# Patient Record
Sex: Male | Born: 1970 | Race: White | Hispanic: No | Smoking: Current every day smoker
Health system: Southern US, Community
[De-identification: ages and names within clinical notes are randomized; demographics above are authoritative.]

## PROBLEM LIST (undated history)

## (undated) DIAGNOSIS — F17200 Nicotine dependence, unspecified, uncomplicated: Secondary | ICD-10-CM

## (undated) DIAGNOSIS — S069X9A Unspecified intracranial injury with loss of consciousness of unspecified duration, initial encounter: Secondary | ICD-10-CM

## (undated) DIAGNOSIS — S069XAA Unspecified intracranial injury with loss of consciousness status unknown, initial encounter: Secondary | ICD-10-CM

## (undated) DIAGNOSIS — S065XAA Traumatic subdural hemorrhage with loss of consciousness status unknown, initial encounter: Secondary | ICD-10-CM

## (undated) DIAGNOSIS — F1994 Other psychoactive substance use, unspecified with psychoactive substance-induced mood disorder: Secondary | ICD-10-CM

## (undated) DIAGNOSIS — A159 Respiratory tuberculosis unspecified: Secondary | ICD-10-CM

## (undated) DIAGNOSIS — M19171 Post-traumatic osteoarthritis, right ankle and foot: Secondary | ICD-10-CM

## (undated) DIAGNOSIS — F1910 Other psychoactive substance abuse, uncomplicated: Secondary | ICD-10-CM

## (undated) HISTORY — PX: OTHER SURGICAL HISTORY: SHX169

## (undated) HISTORY — PX: FRACTURE SURGERY: SHX138

---

## 2002-11-08 ENCOUNTER — Inpatient Hospital Stay (HOSPITAL_COMMUNITY): Admission: AC | Admit: 2002-11-08 | Discharge: 2002-12-30 | Payer: Self-pay

## 2002-11-08 ENCOUNTER — Encounter: Payer: Self-pay | Admitting: *Deleted

## 2002-11-10 ENCOUNTER — Encounter: Payer: Self-pay | Admitting: General Surgery

## 2002-11-11 ENCOUNTER — Encounter: Payer: Self-pay | Admitting: General Surgery

## 2002-11-11 ENCOUNTER — Encounter: Payer: Self-pay | Admitting: Neurosurgery

## 2002-11-12 ENCOUNTER — Encounter: Payer: Self-pay | Admitting: General Surgery

## 2002-11-13 ENCOUNTER — Encounter: Payer: Self-pay | Admitting: General Surgery

## 2002-11-18 ENCOUNTER — Encounter: Payer: Self-pay | Admitting: General Surgery

## 2002-11-19 ENCOUNTER — Encounter: Payer: Self-pay | Admitting: General Surgery

## 2002-11-21 ENCOUNTER — Encounter: Payer: Self-pay | Admitting: General Surgery

## 2002-11-24 ENCOUNTER — Encounter: Payer: Self-pay | Admitting: General Surgery

## 2002-12-21 ENCOUNTER — Encounter: Payer: Self-pay | Admitting: General Surgery

## 2002-12-22 ENCOUNTER — Encounter: Payer: Self-pay | Admitting: General Surgery

## 2002-12-24 ENCOUNTER — Encounter: Payer: Self-pay | Admitting: General Surgery

## 2002-12-30 ENCOUNTER — Inpatient Hospital Stay (HOSPITAL_COMMUNITY)
Admission: RE | Admit: 2002-12-30 | Discharge: 2003-01-20 | Payer: Self-pay | Admitting: Physical Medicine & Rehabilitation

## 2003-01-05 ENCOUNTER — Encounter: Payer: Self-pay | Admitting: Physical Medicine & Rehabilitation

## 2003-01-12 ENCOUNTER — Encounter: Payer: Self-pay | Admitting: Physical Medicine & Rehabilitation

## 2003-01-25 ENCOUNTER — Encounter
Admission: RE | Admit: 2003-01-25 | Discharge: 2003-03-26 | Payer: Self-pay | Admitting: Physical Medicine & Rehabilitation

## 2003-02-16 ENCOUNTER — Encounter: Admission: RE | Admit: 2003-02-16 | Discharge: 2003-02-16 | Payer: Self-pay | Admitting: Internal Medicine

## 2003-04-02 ENCOUNTER — Encounter
Admission: RE | Admit: 2003-04-02 | Discharge: 2003-07-01 | Payer: Self-pay | Admitting: Physical Medicine & Rehabilitation

## 2003-04-06 ENCOUNTER — Encounter: Payer: Self-pay | Admitting: Physical Medicine & Rehabilitation

## 2003-04-06 ENCOUNTER — Ambulatory Visit (HOSPITAL_COMMUNITY)
Admission: RE | Admit: 2003-04-06 | Discharge: 2003-04-06 | Payer: Self-pay | Admitting: Physical Medicine & Rehabilitation

## 2003-04-25 ENCOUNTER — Encounter: Payer: Self-pay | Admitting: Orthopedic Surgery

## 2003-04-25 ENCOUNTER — Ambulatory Visit (HOSPITAL_COMMUNITY): Admission: RE | Admit: 2003-04-25 | Discharge: 2003-04-25 | Payer: Self-pay | Admitting: Orthopedic Surgery

## 2003-05-24 ENCOUNTER — Ambulatory Visit (HOSPITAL_BASED_OUTPATIENT_CLINIC_OR_DEPARTMENT_OTHER): Admission: RE | Admit: 2003-05-24 | Discharge: 2003-05-24 | Payer: Self-pay | Admitting: Orthopedic Surgery

## 2003-06-08 ENCOUNTER — Encounter: Admission: RE | Admit: 2003-06-08 | Discharge: 2003-06-08 | Payer: Self-pay | Admitting: Internal Medicine

## 2003-06-08 ENCOUNTER — Encounter: Admission: RE | Admit: 2003-06-08 | Discharge: 2003-07-21 | Payer: Self-pay | Admitting: Orthopedic Surgery

## 2003-08-19 ENCOUNTER — Encounter: Admission: RE | Admit: 2003-08-19 | Discharge: 2003-08-19 | Payer: Self-pay | Admitting: Internal Medicine

## 2003-11-25 ENCOUNTER — Encounter: Admission: RE | Admit: 2003-11-25 | Discharge: 2003-11-25 | Payer: Self-pay | Admitting: Internal Medicine

## 2003-12-23 ENCOUNTER — Encounter: Admission: RE | Admit: 2003-12-23 | Discharge: 2003-12-23 | Payer: Self-pay | Admitting: Internal Medicine

## 2004-01-19 ENCOUNTER — Encounter: Admission: RE | Admit: 2004-01-19 | Discharge: 2004-01-19 | Payer: Self-pay | Admitting: Internal Medicine

## 2004-03-03 ENCOUNTER — Encounter: Admission: RE | Admit: 2004-03-03 | Discharge: 2004-03-03 | Payer: Self-pay | Admitting: Internal Medicine

## 2004-03-12 ENCOUNTER — Inpatient Hospital Stay (HOSPITAL_COMMUNITY): Admission: EM | Admit: 2004-03-12 | Discharge: 2004-03-16 | Payer: Self-pay | Admitting: Psychiatry

## 2004-03-12 ENCOUNTER — Ambulatory Visit: Payer: Self-pay | Admitting: Psychiatry
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
## 2004-07-24 ENCOUNTER — Ambulatory Visit: Payer: Self-pay | Admitting: Psychiatry

## 2004-07-24 ENCOUNTER — Inpatient Hospital Stay (HOSPITAL_COMMUNITY): Admission: AD | Admit: 2004-07-24 | Discharge: 2004-07-25 | Payer: Self-pay | Admitting: Psychiatry

## 2004-07-24 ENCOUNTER — Emergency Department (HOSPITAL_COMMUNITY): Admission: EM | Admit: 2004-07-24 | Discharge: 2004-07-24 | Payer: Self-pay | Admitting: Emergency Medicine

## 2004-11-23 ENCOUNTER — Emergency Department (HOSPITAL_COMMUNITY): Admission: EM | Admit: 2004-11-23 | Discharge: 2004-11-23 | Payer: Self-pay | Admitting: Emergency Medicine

## 2005-12-19 ENCOUNTER — Emergency Department (HOSPITAL_COMMUNITY): Admission: EM | Admit: 2005-12-19 | Discharge: 2005-12-19 | Payer: Self-pay | Admitting: Family Medicine

## 2008-08-30 ENCOUNTER — Emergency Department (HOSPITAL_COMMUNITY): Admission: EM | Admit: 2008-08-30 | Discharge: 2008-08-30 | Payer: Self-pay | Admitting: Emergency Medicine

## 2009-03-14 ENCOUNTER — Emergency Department (HOSPITAL_COMMUNITY): Admission: EM | Admit: 2009-03-14 | Discharge: 2009-03-14 | Payer: Self-pay | Admitting: Emergency Medicine

## 2009-12-13 ENCOUNTER — Emergency Department (HOSPITAL_COMMUNITY): Admission: EM | Admit: 2009-12-13 | Discharge: 2009-12-13 | Payer: Self-pay | Admitting: Emergency Medicine

## 2010-03-29 ENCOUNTER — Emergency Department (HOSPITAL_COMMUNITY): Admission: EM | Admit: 2010-03-29 | Discharge: 2010-03-29 | Payer: Self-pay | Admitting: Emergency Medicine

## 2010-08-19 ENCOUNTER — Emergency Department (HOSPITAL_COMMUNITY): Payer: No Typology Code available for payment source

## 2010-08-19 ENCOUNTER — Emergency Department (HOSPITAL_COMMUNITY)
Admission: EM | Admit: 2010-08-19 | Discharge: 2010-08-19 | Disposition: A | Discharge status: Home / Self Care | Payer: No Typology Code available for payment source | Attending: Emergency Medicine | Admitting: Emergency Medicine

## 2010-08-19 DIAGNOSIS — IMO0002 Reserved for concepts with insufficient information to code with codable children: Secondary | ICD-10-CM | POA: Insufficient documentation

## 2010-08-19 DIAGNOSIS — M79609 Pain in unspecified limb: Secondary | ICD-10-CM | POA: Insufficient documentation

## 2010-08-19 DIAGNOSIS — S0990XA Unspecified injury of head, initial encounter: Secondary | ICD-10-CM | POA: Insufficient documentation

## 2010-08-19 DIAGNOSIS — F1910 Other psychoactive substance abuse, uncomplicated: Secondary | ICD-10-CM | POA: Insufficient documentation

## 2010-08-19 DIAGNOSIS — M25559 Pain in unspecified hip: Secondary | ICD-10-CM | POA: Insufficient documentation

## 2010-08-19 LAB — RAPID URINE DRUG SCREEN, HOSP PERFORMED
Amphetamines: NOT DETECTED
Cocaine: POSITIVE — AB
Tetrahydrocannabinol: NOT DETECTED

## 2010-08-19 LAB — DIFFERENTIAL
Eosinophils Relative: 6 % — ABNORMAL HIGH (ref 0–5)
Lymphocytes Relative: 28 % (ref 12–46)
Lymphs Abs: 3.2 10*3/uL (ref 0.7–4.0)
Monocytes Absolute: 1 10*3/uL (ref 0.1–1.0)
Monocytes Relative: 9 % (ref 3–12)

## 2010-08-19 LAB — BASIC METABOLIC PANEL
CO2: 27 mEq/L (ref 19–32)
Calcium: 9.7 mg/dL (ref 8.4–10.5)
Chloride: 103 mEq/L (ref 96–112)
Creatinine, Ser: 0.93 mg/dL (ref 0.4–1.5)
Glucose, Bld: 78 mg/dL (ref 70–99)

## 2010-08-19 LAB — CBC
HCT: 50.7 % (ref 39.0–52.0)
Hemoglobin: 18.1 g/dL — ABNORMAL HIGH (ref 13.0–17.0)
MCHC: 35.7 g/dL (ref 30.0–36.0)
MCV: 89.7 fL (ref 78.0–100.0)
RDW: 13.3 % (ref 11.5–15.5)

## 2010-08-20 ENCOUNTER — Emergency Department (HOSPITAL_COMMUNITY)
Admission: EM | Admit: 2010-08-20 | Discharge: 2010-08-20 | Disposition: A | Discharge status: Home / Self Care | Payer: No Typology Code available for payment source | Attending: Emergency Medicine | Admitting: Emergency Medicine

## 2010-08-20 DIAGNOSIS — M542 Cervicalgia: Secondary | ICD-10-CM | POA: Insufficient documentation

## 2010-08-20 DIAGNOSIS — R51 Headache: Secondary | ICD-10-CM | POA: Insufficient documentation

## 2010-11-30 ENCOUNTER — Emergency Department (HOSPITAL_COMMUNITY)
Admission: EM | Admit: 2010-11-30 | Discharge: 2010-11-30 | Disposition: A | Discharge status: Home / Self Care | Payer: Self-pay | Attending: Emergency Medicine | Admitting: Emergency Medicine

## 2010-11-30 ENCOUNTER — Emergency Department (HOSPITAL_COMMUNITY): Payer: Self-pay

## 2010-11-30 DIAGNOSIS — S99919A Unspecified injury of unspecified ankle, initial encounter: Secondary | ICD-10-CM | POA: Insufficient documentation

## 2010-11-30 DIAGNOSIS — M25569 Pain in unspecified knee: Secondary | ICD-10-CM | POA: Insufficient documentation

## 2010-11-30 DIAGNOSIS — Y92009 Unspecified place in unspecified non-institutional (private) residence as the place of occurrence of the external cause: Secondary | ICD-10-CM | POA: Insufficient documentation

## 2010-11-30 DIAGNOSIS — S8990XA Unspecified injury of unspecified lower leg, initial encounter: Secondary | ICD-10-CM | POA: Insufficient documentation

## 2010-11-30 DIAGNOSIS — IMO0002 Reserved for concepts with insufficient information to code with codable children: Secondary | ICD-10-CM | POA: Insufficient documentation

## 2010-11-30 DIAGNOSIS — W010XXA Fall on same level from slipping, tripping and stumbling without subsequent striking against object, initial encounter: Secondary | ICD-10-CM | POA: Insufficient documentation

## 2010-11-30 DIAGNOSIS — S8000XA Contusion of unspecified knee, initial encounter: Secondary | ICD-10-CM | POA: Insufficient documentation
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
## 2011-12-11 ENCOUNTER — Emergency Department (HOSPITAL_COMMUNITY)
Admission: EM | Admit: 2011-12-11 | Discharge: 2011-12-11 | Disposition: A | Discharge status: Home Health | Payer: Self-pay | Attending: Emergency Medicine | Admitting: Emergency Medicine

## 2011-12-11 ENCOUNTER — Encounter (HOSPITAL_COMMUNITY): Payer: Self-pay | Admitting: Emergency Medicine

## 2011-12-11 DIAGNOSIS — W57XXXA Bitten or stung by nonvenomous insect and other nonvenomous arthropods, initial encounter: Secondary | ICD-10-CM

## 2011-12-11 DIAGNOSIS — S30861A Insect bite (nonvenomous) of abdominal wall, initial encounter: Secondary | ICD-10-CM

## 2011-12-11 DIAGNOSIS — S90569A Insect bite (nonvenomous), unspecified ankle, initial encounter: Secondary | ICD-10-CM | POA: Insufficient documentation
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
## 2011-12-11 NOTE — ED Provider Notes (Signed)
History     CSN: [id number]  Arrival date & time 12/11/11  1848   None     Chief Complaint  Patient presents with  . Tick Removal    (Consider location/radiation/quality/duration/timing/severity/associated sxs/prior treatment) HPI Comments: Patient with 2 ticks embedded in anterior thighs for 1 day  The history is provided by the patient.    History reviewed. No pertinent past medical history.  History reviewed. No pertinent past surgical history.  No family history on file.  History  Substance Use Topics  . Smoking status: Current Everyday Smoker  . Smokeless tobacco: Not on file  . Alcohol Use: Yes      Review of Systems  Constitutional: Negative for fever.  Musculoskeletal: Negative for joint swelling.  Skin: Positive for rash.    Allergies  Review of patient's allergies indicates no known allergies.  Home Medications  No current outpatient prescriptions on file.  BP 128/85  Pulse 88  Temp(Src) 98.4 F (36.9 C) (Oral)  Resp 16  SpO2 97%  Physical Exam  Constitutional: He is oriented to person, place, and time. He appears well-developed.  HENT:  Head: Normocephalic.  Eyes: Pupils are equal, round, and reactive to light.  Neck: Normal range of motion.  Cardiovascular: Normal rate.   Pulmonary/Chest: Effort normal.  Musculoskeletal: Normal range of motion.  Neurological: He is alert and oriented to person, place, and time.  Skin: Skin is warm. There is erythema.    ED Course  FOREIGN BODY REMOVAL Date/Time: 12/11/2011 8:35 PM Performed by: Arman Filter Authorized by: Arman Filter Consent: Verbal consent obtained. Risks and benefits: risks, benefits and alternatives were discussed Consent given by: patient Patient understanding: patient states understanding of the procedure being performed Required items: required blood products, implants, devices, and special equipment available Patient identity confirmed: verbally with patient Body  area: skin Anesthetic total: 0 ml Patient sedated: no Patient restrained: no Localization method: visualized Removal mechanism: hemostat Tendon involvement: none Complexity: simple 2 objects recovered. Objects recovered: tick Patient tolerance: Patient tolerated the procedure well with no immediate complications.   (including critical care time)  Labs Reviewed - No data to display No results found.   1. Tick bite of groin       MDM  Superficially embedded ticks in anterior thighs removed intack         Arman Filter, NP 12/11/11 2036  Arman Filter, NP 12/11/11 2036

## 2011-12-12 NOTE — ED Provider Notes (Signed)
Medical screening examination/treatment/procedure(s) were performed by non-physician practitioner and as supervising physician I was immediately available for consultation/collaboration.  Godson Pollan, MD 12/12/11 0942 

## 2012-01-30 ENCOUNTER — Emergency Department (HOSPITAL_COMMUNITY): Payer: Self-pay

## 2012-01-30 ENCOUNTER — Encounter (HOSPITAL_COMMUNITY): Payer: Self-pay | Admitting: Emergency Medicine

## 2012-01-30 ENCOUNTER — Emergency Department (HOSPITAL_COMMUNITY)
Admission: EM | Admit: 2012-01-30 | Discharge: 2012-01-30 | Disposition: A | Discharge status: Home / Self Care | Payer: Self-pay | Attending: Emergency Medicine | Admitting: Emergency Medicine

## 2012-01-30 DIAGNOSIS — S069XAA Unspecified intracranial injury with loss of consciousness status unknown, initial encounter: Secondary | ICD-10-CM | POA: Insufficient documentation

## 2012-01-30 DIAGNOSIS — Z23 Encounter for immunization: Secondary | ICD-10-CM | POA: Insufficient documentation

## 2012-01-30 DIAGNOSIS — IMO0002 Reserved for concepts with insufficient information to code with codable children: Secondary | ICD-10-CM

## 2012-01-30 DIAGNOSIS — F17200 Nicotine dependence, unspecified, uncomplicated: Secondary | ICD-10-CM | POA: Insufficient documentation

## 2012-01-30 DIAGNOSIS — S61209A Unspecified open wound of unspecified finger without damage to nail, initial encounter: Secondary | ICD-10-CM | POA: Insufficient documentation

## 2012-01-30 DIAGNOSIS — S6710XA Crushing injury of unspecified finger(s), initial encounter: Secondary | ICD-10-CM | POA: Insufficient documentation

## 2012-01-30 DIAGNOSIS — S069X9A Unspecified intracranial injury with loss of consciousness of unspecified duration, initial encounter: Secondary | ICD-10-CM | POA: Insufficient documentation

## 2012-01-30 DIAGNOSIS — W230XXA Caught, crushed, jammed, or pinched between moving objects, initial encounter: Secondary | ICD-10-CM | POA: Insufficient documentation

## 2012-01-30 HISTORY — DX: Unspecified intracranial injury with loss of consciousness status unknown, initial encounter: S06.9XAA

## 2012-01-30 HISTORY — DX: Unspecified intracranial injury with loss of consciousness of unspecified duration, initial encounter: S06.9X9A

## 2012-01-30 MED ORDER — OXYCODONE-ACETAMINOPHEN 5-325 MG PO TABS: 1.0000 | ORAL_TABLET | ORAL | Status: AC | PRN

## 2012-01-30 MED ORDER — AMOXICILLIN-POT CLAVULANATE 875-125 MG PO TABS: 1.0000 | ORAL_TABLET | Freq: Two times a day (BID) | ORAL | Status: AC

## 2012-01-30 MED ORDER — TETANUS-DIPHTHERIA TOXOIDS TD 5-2 LFU IM INJ
0.5000 mL | INJECTION | Freq: Once | INTRAMUSCULAR | Status: AC
  Administered 2012-01-30: 0.5 mL via INTRAMUSCULAR
  Filled 2012-01-30 (×2): qty 0.5

## 2012-01-30 MED ORDER — CEFAZOLIN SODIUM 1 G IJ SOLR
1.0000 g | Freq: Once | INTRAMUSCULAR | Status: AC
  Administered 2012-01-30: 1 g via INTRAMUSCULAR
  Filled 2012-01-30: qty 10

## 2012-01-30 MED ORDER — OXYCODONE-ACETAMINOPHEN 5-325 MG PO TABS
2.0000 | ORAL_TABLET | Freq: Once | ORAL | Status: AC
  Administered 2012-01-30: 2 via ORAL
  Filled 2012-01-30: qty 2

## 2012-01-30 MED ORDER — BUPIVACAINE HCL (PF) 0.5 % IJ SOLN
10.0000 mL | Freq: Once | INTRAMUSCULAR | Status: DC
  Filled 2012-01-30: qty 10
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
## 2012-02-01 NOTE — ED Provider Notes (Signed)
Medical screening examination/treatment/procedure(s) were performed by non-physician practitioner and as supervising physician I was immediately available for consultation/collaboration.   Cinthya Bors, MD 02/01/12 1535 

## 2014-08-26 ENCOUNTER — Ambulatory Visit (HOSPITAL_COMMUNITY): Payer: Self-pay | Admitting: Psychiatry

## 2016-06-17 ENCOUNTER — Encounter (HOSPITAL_COMMUNITY): Admission: EM | Disposition: A | Discharge status: Home / Self Care | Payer: Self-pay | Source: Home / Self Care

## 2016-06-17 ENCOUNTER — Inpatient Hospital Stay (HOSPITAL_COMMUNITY): Payer: Self-pay | Admitting: Anesthesiology

## 2016-06-17 ENCOUNTER — Encounter (HOSPITAL_COMMUNITY): Payer: Self-pay | Admitting: Emergency Medicine

## 2016-06-17 ENCOUNTER — Inpatient Hospital Stay (HOSPITAL_COMMUNITY)
Admission: EM | Admit: 2016-06-17 | Discharge: 2016-06-22 | DRG: 492 | Disposition: A | Discharge status: Home / Self Care | Payer: Self-pay | Attending: General Surgery | Admitting: General Surgery

## 2016-06-17 ENCOUNTER — Emergency Department (HOSPITAL_COMMUNITY): Payer: Self-pay

## 2016-06-17 DIAGNOSIS — R402352 Coma scale, best motor response, localizes pain, at arrival to emergency department: Secondary | ICD-10-CM | POA: Diagnosis present

## 2016-06-17 DIAGNOSIS — T794XXA Traumatic shock, initial encounter: Secondary | ICD-10-CM | POA: Diagnosis present

## 2016-06-17 DIAGNOSIS — S3022XA Contusion of scrotum and testes, initial encounter: Secondary | ICD-10-CM | POA: Diagnosis present

## 2016-06-17 DIAGNOSIS — S20211A Contusion of right front wall of thorax, initial encounter: Secondary | ICD-10-CM | POA: Diagnosis present

## 2016-06-17 DIAGNOSIS — R591 Generalized enlarged lymph nodes: Secondary | ICD-10-CM | POA: Diagnosis present

## 2016-06-17 DIAGNOSIS — S82899B Other fracture of unspecified lower leg, initial encounter for open fracture type I or II: Secondary | ICD-10-CM

## 2016-06-17 DIAGNOSIS — J9600 Acute respiratory failure, unspecified whether with hypoxia or hypercapnia: Secondary | ICD-10-CM | POA: Diagnosis present

## 2016-06-17 DIAGNOSIS — T148XXA Other injury of unspecified body region, initial encounter: Secondary | ICD-10-CM

## 2016-06-17 DIAGNOSIS — Z09 Encounter for follow-up examination after completed treatment for conditions other than malignant neoplasm: Secondary | ICD-10-CM

## 2016-06-17 DIAGNOSIS — S82871B Displaced pilon fracture of right tibia, initial encounter for open fracture type I or II: Secondary | ICD-10-CM | POA: Diagnosis present

## 2016-06-17 DIAGNOSIS — Z23 Encounter for immunization: Secondary | ICD-10-CM

## 2016-06-17 DIAGNOSIS — R402222 Coma scale, best verbal response, incomprehensible words, at arrival to emergency department: Secondary | ICD-10-CM | POA: Diagnosis present

## 2016-06-17 DIAGNOSIS — M542 Cervicalgia: Secondary | ICD-10-CM

## 2016-06-17 DIAGNOSIS — S82871C Displaced pilon fracture of right tibia, initial encounter for open fracture type IIIA, IIIB, or IIIC: Principal | ICD-10-CM | POA: Diagnosis present

## 2016-06-17 DIAGNOSIS — Y92410 Unspecified street and highway as the place of occurrence of the external cause: Secondary | ICD-10-CM

## 2016-06-17 DIAGNOSIS — R402122 Coma scale, eyes open, to pain, at arrival to emergency department: Secondary | ICD-10-CM | POA: Diagnosis present

## 2016-06-17 DIAGNOSIS — R4189 Other symptoms and signs involving cognitive functions and awareness: Secondary | ICD-10-CM

## 2016-06-17 DIAGNOSIS — S0101XA Laceration without foreign body of scalp, initial encounter: Secondary | ICD-10-CM | POA: Diagnosis present

## 2016-06-17 DIAGNOSIS — D62 Acute posthemorrhagic anemia: Secondary | ICD-10-CM | POA: Diagnosis present

## 2016-06-17 DIAGNOSIS — R571 Hypovolemic shock: Secondary | ICD-10-CM | POA: Diagnosis present

## 2016-06-17 DIAGNOSIS — R262 Difficulty in walking, not elsewhere classified: Secondary | ICD-10-CM

## 2016-06-17 DIAGNOSIS — J9811 Atelectasis: Secondary | ICD-10-CM | POA: Diagnosis not present

## 2016-06-17 DIAGNOSIS — F17200 Nicotine dependence, unspecified, uncomplicated: Secondary | ICD-10-CM | POA: Diagnosis present

## 2016-06-17 HISTORY — PX: I & D EXTREMITY: SHX5045

## 2016-06-17 HISTORY — PX: EXTERNAL FIXATION LEG: SHX1549

## 2016-06-17 LAB — PROTIME-INR
INR: 1.17
PROTHROMBIN TIME: 14.9 s (ref 11.4–15.2)

## 2016-06-17 LAB — I-STAT CHEM 8, ED
BUN: 17 mg/dL (ref 6–20)
CALCIUM ION: 1.07 mmol/L — AB (ref 1.15–1.40)
CHLORIDE: 105 mmol/L (ref 101–111)
CREATININE: 1 mg/dL (ref 0.61–1.24)
Glucose, Bld: 149 mg/dL — ABNORMAL HIGH (ref 65–99)
HCT: 34 % — ABNORMAL LOW (ref 39.0–52.0)
Hemoglobin: 11.6 g/dL — ABNORMAL LOW (ref 13.0–17.0)
Potassium: 4.2 mmol/L (ref 3.5–5.1)
Sodium: 143 mmol/L (ref 135–145)
TCO2: 24 mmol/L (ref 0–100)

## 2016-06-17 LAB — I-STAT ARTERIAL BLOOD GAS, ED
ACID-BASE DEFICIT: 4 mmol/L — AB (ref 0.0–2.0)
BICARBONATE: 23.5 mmol/L (ref 20.0–28.0)
O2 SAT: 98 %
PO2 ART: 130 mmHg — AB (ref 83.0–108.0)
TCO2: 25 mmol/L (ref 0–100)
pCO2 arterial: 52 mmHg — ABNORMAL HIGH (ref 32.0–48.0)
pH, Arterial: 7.261 — ABNORMAL LOW (ref 7.350–7.450)

## 2016-06-17 LAB — CBC
HCT: 35.5 % — ABNORMAL LOW (ref 39.0–52.0)
HEMOGLOBIN: 12.2 g/dL — AB (ref 13.0–17.0)
MCH: 30.7 pg (ref 26.0–34.0)
MCHC: 34.4 g/dL (ref 30.0–36.0)
MCV: 89.4 fL (ref 78.0–100.0)
PLATELETS: 255 10*3/uL (ref 150–400)
RBC: 3.97 MIL/uL — ABNORMAL LOW (ref 4.22–5.81)
RDW: 13.6 % (ref 11.5–15.5)
WBC: 16.8 10*3/uL — ABNORMAL HIGH (ref 4.0–10.5)

## 2016-06-17 LAB — COMPREHENSIVE METABOLIC PANEL
ALBUMIN: 2.7 g/dL — AB (ref 3.5–5.0)
ALK PHOS: 50 U/L (ref 38–126)
ALT: 16 U/L — AB (ref 17–63)
ANION GAP: 8 (ref 5–15)
AST: 22 U/L (ref 15–41)
BILIRUBIN TOTAL: 0.3 mg/dL (ref 0.3–1.2)
BUN: 15 mg/dL (ref 6–20)
CALCIUM: 8 mg/dL — AB (ref 8.9–10.3)
CO2: 23 mmol/L (ref 22–32)
CREATININE: 1 mg/dL (ref 0.61–1.24)
Chloride: 110 mmol/L (ref 101–111)
GFR calc Af Amer: 51 mL/min — ABNORMAL LOW (ref >60)
GFR calc non Af Amer: 44 mL/min — ABNORMAL LOW (ref >60)
GLUCOSE: 144 mg/dL — AB (ref 65–99)
Potassium: 4.3 mmol/L (ref 3.5–5.1)
Sodium: 141 mmol/L (ref 135–145)
TOTAL PROTEIN: 5 g/dL — AB (ref 6.5–8.1)

## 2016-06-17 LAB — ETHANOL

## 2016-06-17 LAB — I-STAT CG4 LACTIC ACID, ED: Lactic Acid, Venous: 1.99 mmol/L (ref 0.5–1.9)

## 2016-06-17 SURGERY — EXTERNAL FIXATION, LOWER EXTREMITY
Anesthesia: General | Site: Ankle | Laterality: Right

## 2016-06-17 MED ORDER — TETANUS-DIPHTH-ACELL PERTUSSIS 5-2.5-18.5 LF-MCG/0.5 IM SUSP
0.5000 mL | Freq: Once | INTRAMUSCULAR | Status: AC
  Administered 2016-06-17: 0.5 mL via INTRAMUSCULAR
  Filled 2016-06-17: qty 0.5

## 2016-06-17 MED ORDER — ORAL CARE MOUTH RINSE: 15.0000 mL | Freq: Four times a day (QID) | OROMUCOSAL | Status: DC

## 2016-06-17 MED ORDER — SODIUM CHLORIDE 0.9 % IV SOLN
Freq: Once | INTRAVENOUS | Status: AC
  Administered 2016-06-17: 150 mL/h via INTRAVENOUS

## 2016-06-17 MED ORDER — SUFENTANIL CITRATE 50 MCG/ML IV SOLN
INTRAVENOUS | Status: AC
  Filled 2016-06-17: qty 1

## 2016-06-17 MED ORDER — CHLORHEXIDINE GLUCONATE 0.12% ORAL RINSE (MEDLINE KIT)
15.0000 mL | Freq: Two times a day (BID) | OROMUCOSAL | Status: DC
  Administered 2016-06-18: 15 mL via OROMUCOSAL

## 2016-06-17 MED ORDER — LIDOCAINE 2% (20 MG/ML) 5 ML SYRINGE
INTRAMUSCULAR | Status: AC
  Filled 2016-06-17: qty 5

## 2016-06-17 MED ORDER — FENTANYL 2500MCG IN NS 250ML (10MCG/ML) PREMIX INFUSION
10.0000 ug/h | INTRAVENOUS | Status: DC
  Administered 2016-06-18: 20 ug/h via INTRAVENOUS
  Filled 2016-06-17: qty 250

## 2016-06-17 MED ORDER — LACTATED RINGERS IV SOLN
INTRAVENOUS | Status: DC | PRN
  Administered 2016-06-17 (×2): via INTRAVENOUS

## 2016-06-17 MED ORDER — CEFAZOLIN SODIUM-DEXTROSE 2-3 GM-% IV SOLR
INTRAVENOUS | Status: DC | PRN
  Administered 2016-06-17: 2 g via INTRAVENOUS

## 2016-06-17 MED ORDER — ISOPROPYL ALCOHOL 70 % SOLN
Status: DC | PRN
  Administered 2016-06-17: 1 via TOPICAL

## 2016-06-17 MED ORDER — MIDAZOLAM HCL 2 MG/2ML IJ SOLN
INTRAMUSCULAR | Status: AC
  Filled 2016-06-17: qty 2

## 2016-06-17 MED ORDER — SUCCINYLCHOLINE CHLORIDE 20 MG/ML IJ SOLN
INTRAMUSCULAR | Status: AC | PRN
  Administered 2016-06-17: 150 mg via INTRAVENOUS

## 2016-06-17 MED ORDER — CEFAZOLIN SODIUM 1 G IJ SOLR
INTRAMUSCULAR | Status: AC
  Filled 2016-06-17: qty 20

## 2016-06-17 MED ORDER — FENTANYL 2500MCG IN NS 250ML (10MCG/ML) PREMIX INFUSION
INTRAVENOUS | Status: AC
  Administered 2016-06-17: 10 ug/h
  Filled 2016-06-17: qty 250

## 2016-06-17 MED ORDER — PROPOFOL 10 MG/ML IV BOLUS
INTRAVENOUS | Status: AC
  Filled 2016-06-17: qty 20

## 2016-06-17 MED ORDER — IOPAMIDOL (ISOVUE-300) INJECTION 61%
100.0000 mL | Freq: Once | INTRAVENOUS | Status: AC | PRN
  Administered 2016-06-18: 100 mL via INTRAVENOUS

## 2016-06-17 MED ORDER — ROCURONIUM BROMIDE 100 MG/10ML IV SOLN
INTRAVENOUS | Status: DC | PRN
  Administered 2016-06-17 (×2): 50 mg via INTRAVENOUS

## 2016-06-17 MED ORDER — SODIUM CHLORIDE 0.9 % IJ SOLN
INTRAMUSCULAR | Status: AC
  Filled 2016-06-17: qty 10

## 2016-06-17 MED ORDER — SODIUM CHLORIDE 0.9 % IV SOLN
1.0000 mg/h | INTRAVENOUS | Status: DC
  Filled 2016-06-17: qty 10

## 2016-06-17 MED ORDER — MIDAZOLAM HCL 2 MG/2ML IJ SOLN
INTRAMUSCULAR | Status: DC | PRN
  Administered 2016-06-17: 2 mg via INTRAVENOUS

## 2016-06-17 MED ORDER — ETOMIDATE 2 MG/ML IV SOLN
INTRAVENOUS | Status: AC | PRN
  Administered 2016-06-17: 30 mg via INTRAVENOUS

## 2016-06-17 MED ORDER — SODIUM CHLORIDE 0.9 % IR SOLN
Status: DC | PRN
  Administered 2016-06-17: 6000 mL

## 2016-06-17 MED ORDER — FENTANYL 2500MCG IN NS 250ML (10MCG/ML) PREMIX INFUSION
10.0000 ug/h | INTRAVENOUS | Status: DC
  Administered 2016-06-17: 10 ug/h via INTRAVENOUS

## 2016-06-17 MED ORDER — ROCURONIUM BROMIDE 10 MG/ML (PF) SYRINGE
PREFILLED_SYRINGE | INTRAVENOUS | Status: AC
  Filled 2016-06-17: qty 10

## 2016-06-17 MED ORDER — PHENYLEPHRINE HCL 10 MG/ML IJ SOLN
INTRAMUSCULAR | Status: DC | PRN
  Administered 2016-06-17: 10 ug/min via INTRAVENOUS

## 2016-06-17 MED ORDER — CEFAZOLIN SODIUM-DEXTROSE 2-4 GM/100ML-% IV SOLN
2.0000 g | Freq: Once | INTRAVENOUS | Status: AC
  Administered 2016-06-17: 2 g via INTRAVENOUS
  Filled 2016-06-17: qty 100

## 2016-06-17 MED ORDER — SODIUM CHLORIDE 0.9 % IV SOLN
1.0000 mg/h | INTRAVENOUS | Status: DC
  Administered 2016-06-17: 1 mg/h via INTRAVENOUS
  Administered 2016-06-18: 4 mg/h via INTRAVENOUS
  Filled 2016-06-17 (×2): qty 10

## 2016-06-17 MED ORDER — MIDAZOLAM HCL 10 MG/2ML IJ SOLN: 0.1000 mg/kg/h | INTRAVENOUS | Status: DC

## 2016-06-17 MED ORDER — ONDANSETRON HCL 4 MG/2ML IJ SOLN
INTRAMUSCULAR | Status: AC
  Filled 2016-06-17: qty 2

## 2016-06-17 MED ORDER — ALBUMIN HUMAN 5 % IV SOLN
INTRAVENOUS | Status: DC | PRN
  Administered 2016-06-17 (×2): via INTRAVENOUS

## 2016-06-17 SURGICAL SUPPLY — 71 items
BAG SPEC THK2 15X12 ZIP CLS (MISCELLANEOUS)
BAG ZIPLOCK 12X15 (MISCELLANEOUS) ×1 IMPLANT
BANDAGE ACE 4X5 VEL STRL LF (GAUZE/BANDAGES/DRESSINGS) ×3 IMPLANT
BANDAGE ACE 6X5 VEL STRL LF (GAUZE/BANDAGES/DRESSINGS) ×3 IMPLANT
BAR EXFX 200X11 NS LF (EXFIX) ×1
BAR EXFX 350X11 NS LF (EXFIX) ×1
BAR EXFX 400X11 NS LF (EXFIX) ×1
BAR GLASS FIBER EXFX 11X200 (EXFIX) ×2 IMPLANT
BAR GLASS FIBER EXFX 11X350 (EXFIX) ×2 IMPLANT
BAR GLASS FIBER EXFX 11X400 (EXFIX) ×2 IMPLANT
BNDG COHESIVE 4X5 TAN STRL (GAUZE/BANDAGES/DRESSINGS) ×3 IMPLANT
BNDG GAUZE ELAST 4 BULKY (GAUZE/BANDAGES/DRESSINGS) ×2 IMPLANT
CHLORAPREP W/TINT 26ML (MISCELLANEOUS) ×1 IMPLANT
CLAMP BLUE BAR TO BAR (EXFIX) ×4 IMPLANT
CLAMP BLUE BAR TO PIN (EXFIX) ×4 IMPLANT
CLOSURE WOUND 1/2 X4 (GAUZE/BANDAGES/DRESSINGS)
CUFF TOURN SGL QUICK 34 (TOURNIQUET CUFF)
CUFF TRNQT CYL 34X4X40X1 (TOURNIQUET CUFF) ×1 IMPLANT
DRAPE C-ARM 42X120 X-RAY (DRAPES) ×3 IMPLANT
DRAPE C-ARMOR (DRAPES) ×3 IMPLANT
DRAPE LG THREE QUARTER DISP (DRAPES) ×1 IMPLANT
DRAPE U-SHAPE 47X51 STRL (DRAPES) ×3 IMPLANT
DRSG ADAPTIC 3X8 NADH LF (GAUZE/BANDAGES/DRESSINGS) ×1 IMPLANT
DRSG PAD ABDOMINAL 8X10 ST (GAUZE/BANDAGES/DRESSINGS) ×3 IMPLANT
ELECT REM PT RETURN 9FT ADLT (ELECTROSURGICAL) ×3
ELECTRODE REM PT RTRN 9FT ADLT (ELECTROSURGICAL) ×1 IMPLANT
FACESHIELD WRAPAROUND (MASK) ×3 IMPLANT
FACESHIELD WRAPAROUND OR TEAM (MASK) IMPLANT
GAUZE SPONGE 4X4 12PLY STRL (GAUZE/BANDAGES/DRESSINGS) ×4 IMPLANT
GAUZE XEROFORM 5X9 LF (GAUZE/BANDAGES/DRESSINGS) ×2 IMPLANT
GLOVE BIOGEL PI IND STRL 7.0 (GLOVE) IMPLANT
GLOVE BIOGEL PI IND STRL 7.5 (GLOVE) ×1 IMPLANT
GLOVE BIOGEL PI IND STRL 8.5 (GLOVE) ×1 IMPLANT
GLOVE BIOGEL PI INDICATOR 7.0 (GLOVE) ×2
GLOVE BIOGEL PI INDICATOR 7.5 (GLOVE)
GLOVE BIOGEL PI INDICATOR 8.5 (GLOVE) ×2
GLOVE ECLIPSE 8.0 STRL XLNG CF (GLOVE) IMPLANT
GLOVE ORTHO TXT STRL SZ7.5 (GLOVE) ×4 IMPLANT
GLOVE SURG ORTHO 8.0 STRL STRW (GLOVE) ×3 IMPLANT
GLOVE SURG SS PI 7.0 STRL IVOR (GLOVE) ×2 IMPLANT
GOWN SPEC L3 XXLG W/TWL (GOWN DISPOSABLE) ×4 IMPLANT
GOWN STRL REUS W/TWL LRG LVL3 (GOWN DISPOSABLE) ×1 IMPLANT
MANIFOLD NEPTUNE II (INSTRUMENTS) ×3 IMPLANT
MARKER SKIN DUAL TIP RULER LAB (MISCELLANEOUS) ×2 IMPLANT
NS IRRIG 1000ML POUR BTL (IV SOLUTION) ×3 IMPLANT
PACK GENERAL/GYN (CUSTOM PROCEDURE TRAY) ×2 IMPLANT
PACK TOTAL KNEE CUSTOM (KITS) ×1 IMPLANT
PAD ABD 8X10 STRL (GAUZE/BANDAGES/DRESSINGS) ×2 IMPLANT
PAD CAST 4YDX4 CTTN HI CHSV (CAST SUPPLIES) ×2 IMPLANT
PADDING CAST ABS 6INX4YD NS (CAST SUPPLIES)
PADDING CAST ABS COTTON 6X4 NS (CAST SUPPLIES) ×2 IMPLANT
PADDING CAST COTTON 4X4 STRL (CAST SUPPLIES) ×6
PIN CLAMP 2BAR 75MM BLUE (EXFIX) ×2 IMPLANT
PIN HALF YELLOW 5X160X35 (EXFIX) ×4 IMPLANT
PIN TRANSFIXING 5.0 (EXFIX) ×2 IMPLANT
POSITIONER SURGICAL ARM (MISCELLANEOUS) ×1 IMPLANT
SOL PREP POV-IOD 4OZ 10% (MISCELLANEOUS) ×3 IMPLANT
SPLINT PLASTER CAST XFAST 5X30 (CAST SUPPLIES) IMPLANT
SPLINT PLASTER XFAST SET 5X30 (CAST SUPPLIES) ×2
SPONGE GAUZE 4X4 12PLY STER LF (GAUZE/BANDAGES/DRESSINGS) ×2 IMPLANT
STRIP CLOSURE SKIN 1/2X4 (GAUZE/BANDAGES/DRESSINGS) ×1 IMPLANT
SUT ETHILON 2 0 FS 18 (SUTURE) ×2 IMPLANT
SUT ETHILON 3 0 PS 1 (SUTURE) ×1 IMPLANT
SUT MNCRL AB 4-0 PS2 18 (SUTURE) ×3 IMPLANT
SUT VIC AB 1 CT1 27 (SUTURE)
SUT VIC AB 1 CT1 27XBRD ANTBC (SUTURE) ×2 IMPLANT
SUT VIC AB 2-0 CT1 27 (SUTURE) ×3
SUT VIC AB 2-0 CT1 TAPERPNT 27 (SUTURE) ×1 IMPLANT
SUT VIC AB 3-0 PS2 18 (SUTURE) ×3
SUT VIC AB 3-0 PS2 18XBRD (SUTURE) ×1 IMPLANT
TOWEL OR 17X26 10 PK STRL BLUE (TOWEL DISPOSABLE) ×4 IMPLANT

## 2016-06-17 NOTE — Progress Notes (Signed)
RT assisted ED physician with intubation. Bilateral breath sounds present. ETCO2 positive color change. CXR and ABG pending.  

## 2016-06-17 NOTE — Anesthesia Preprocedure Evaluation (Addendum)
Anesthesia Evaluation  Patient identified by MRN, date of birth, ID band Patient unresponsive    Reviewed: Patient's Chart, lab work & pertinent test results, Unable to perform ROS - Chart review only  Airway Mallampati: Intubated  TM Distance: >3 FB     Dental   Pulmonary    breath sounds clear to auscultation       Cardiovascular  Rhythm:Regular Rate:Normal     Neuro/Psych    GI/Hepatic   Endo/Other    Renal/GU      Musculoskeletal   Abdominal   Peds  Hematology   Anesthesia Other Findings   Reproductive/Obstetrics                            Anesthesia Physical Anesthesia Plan  ASA: IV and emergent  Anesthesia Plan: General   Post-op Pain Management:    Induction: Intravenous  Airway Management Planned: Oral ETT  Additional Equipment:   Intra-op Plan:   Post-operative Plan: Post-operative intubation/ventilation  Informed Consent: I have reviewed the patients History and Physical, chart, labs and discussed the procedure including the risks, benefits and alternatives for the proposed anesthesia with the patient or authorized representative who has indicated his/her understanding and acceptance.     Plan Discussed with: CRNA and Anesthesiologist  Anesthesia Plan Comments:         Anesthesia Quick Evaluation

## 2016-06-17 NOTE — Procedures (Signed)
Central Venous Catheter Insertion Procedure Note Melynda RippleWoodford Qq Doe [id number] 07/16/1873  Procedure: Insertion of Central Venous Catheter Indications: Hemorrhagic Shock  Procedure Details Consent: Unable to obtain consent because of emergent medical necessity. Time Out: Verified patient identification, verified procedure, site/side was marked, verified correct patient position, special equipment/implants available, medications/allergies/relevent history reviewed, required imaging and test results available.  Performed  Maximum sterile technique was used including gloves, hand hygiene and sheet. Skin prep: Chlorhexidine; local anesthetic administered A antimicrobial bonded/coated triple lumen catheter was placed in the right femoral vein due to emergent situation using the Seldinger technique.  Evaluation Blood flow good Complications: No apparent complications Patient did tolerate procedure well.   Marigene Ehlersamirez Jr., Tallyn Holroyd 06/17/2016, 9:41 PM
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
## 2016-06-17 NOTE — Progress Notes (Signed)
Orthopedic Tech Progress Note Patient Details:  Corey Moss 07/16/1873 [id number]  [name], male   DOB: 07/16/1873, [age] y.o.   MRN: [id number] Assisted dr to reapply ems splint.  Trinna PostMartinez, Kristyana Notte J 06/17/2016, 8:39 PM
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
## 2016-06-17 NOTE — ED Notes (Signed)
Taken to the OR at this time.  Family taken to waiting room by ed Tech.
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
## 2016-06-17 NOTE — ED Notes (Signed)
Paged ortho 

## 2016-06-18 ENCOUNTER — Inpatient Hospital Stay (HOSPITAL_COMMUNITY): Payer: Self-pay

## 2016-06-18 ENCOUNTER — Encounter (HOSPITAL_COMMUNITY): Payer: Self-pay | Admitting: Emergency Medicine

## 2016-06-18 DIAGNOSIS — S82871B Displaced pilon fracture of right tibia, initial encounter for open fracture type I or II: Secondary | ICD-10-CM | POA: Diagnosis present

## 2016-06-18 LAB — CBC
HCT: 33.6 % — ABNORMAL LOW (ref 39.0–52.0)
HCT: 31 % — ABNORMAL LOW (ref 39.0–52.0)
HEMOGLOBIN: 10.6 g/dL — AB (ref 13.0–17.0)
Hemoglobin: 11.4 g/dL — ABNORMAL LOW (ref 13.0–17.0)
MCH: 29.8 pg (ref 26.0–34.0)
MCH: 30 pg (ref 26.0–34.0)
MCHC: 33.9 g/dL (ref 30.0–36.0)
MCHC: 34.2 g/dL (ref 30.0–36.0)
MCV: 87.7 fL (ref 78.0–100.0)
MCV: 87.8 fL (ref 78.0–100.0)
PLATELETS: 168 10*3/uL (ref 150–400)
Platelets: 169 10*3/uL (ref 150–400)
RBC: 3.83 MIL/uL — AB (ref 4.22–5.81)
RBC: 3.53 MIL/uL — AB (ref 4.22–5.81)
RDW: 14 % (ref 11.5–15.5)
RDW: 14.2 % (ref 11.5–15.5)
WBC: 16 10*3/uL — ABNORMAL HIGH (ref 4.0–10.5)
WBC: 13.5 10*3/uL — AB (ref 4.0–10.5)

## 2016-06-18 LAB — TYPE AND SCREEN
ABO/RH(D): O NEG
ANTIBODY SCREEN: NEGATIVE
UNIT DIVISION: 0
Unit division: 0

## 2016-06-18 LAB — CREATININE, SERUM
CREATININE: 0.89 mg/dL (ref 0.61–1.24)
GFR calc Af Amer: >60 mL/min (ref >60)
GFR calc non Af Amer: >60 mL/min (ref >60)

## 2016-06-18 LAB — BASIC METABOLIC PANEL
Anion gap: 6 (ref 5–15)
BUN: 11 mg/dL (ref 6–20)
CHLORIDE: 108 mmol/L (ref 101–111)
CO2: 26 mmol/L (ref 22–32)
Calcium: 7.7 mg/dL — ABNORMAL LOW (ref 8.9–10.3)
Creatinine, Ser: 0.83 mg/dL (ref 0.61–1.24)
GFR calc non Af Amer: >60 mL/min (ref >60)
Glucose, Bld: 116 mg/dL — ABNORMAL HIGH (ref 65–99)
POTASSIUM: 3.6 mmol/L (ref 3.5–5.1)
SODIUM: 140 mmol/L (ref 135–145)

## 2016-06-18 LAB — GLUCOSE, CAPILLARY: Glucose-Capillary: 106 mg/dL — ABNORMAL HIGH (ref 65–99)

## 2016-06-18 LAB — LACTIC ACID, PLASMA: LACTIC ACID, VENOUS: 1.3 mmol/L (ref 0.5–1.9)

## 2016-06-18 LAB — CDS SEROLOGY

## 2016-06-18 LAB — MRSA PCR SCREENING: MRSA BY PCR: NEGATIVE

## 2016-06-18 LAB — ABO/RH: ABO/RH(D): O NEG

## 2016-06-18 LAB — BLOOD PRODUCT ORDER (VERBAL) VERIFICATION

## 2016-06-18 MED ORDER — OXYCODONE HCL 5 MG PO TABS
5.0000 mg | ORAL_TABLET | Freq: Once | ORAL | Status: AC | PRN
  Administered 2016-06-18: 5 mg via ORAL
  Filled 2016-06-18: qty 1

## 2016-06-18 MED ORDER — CEFAZOLIN IN D5W 1 GM/50ML IV SOLN
1.0000 g | Freq: Three times a day (TID) | INTRAVENOUS | Status: DC
  Administered 2016-06-18 – 2016-06-19 (×4): 1 g via INTRAVENOUS
  Filled 2016-06-18 (×6): qty 50

## 2016-06-18 MED ORDER — ORAL CARE MOUTH RINSE
15.0000 mL | Freq: Two times a day (BID) | OROMUCOSAL | Status: DC
  Administered 2016-06-18 – 2016-06-19 (×3): 15 mL via OROMUCOSAL

## 2016-06-18 MED ORDER — CEFAZOLIN IN D5W 1 GM/50ML IV SOLN
1.0000 g | Freq: Three times a day (TID) | INTRAVENOUS | Status: DC
  Administered 2016-06-18: 1 g via INTRAVENOUS
  Filled 2016-06-18 (×3): qty 50

## 2016-06-18 MED ORDER — METOCLOPRAMIDE HCL 5 MG/ML IJ SOLN: 5.0000 mg | Freq: Three times a day (TID) | INTRAMUSCULAR | Status: DC | PRN

## 2016-06-18 MED ORDER — ONDANSETRON HCL 4 MG/2ML IJ SOLN: 4.0000 mg | Freq: Four times a day (QID) | INTRAMUSCULAR | Status: DC | PRN

## 2016-06-18 MED ORDER — OXYCODONE HCL 5 MG/5ML PO SOLN: 5.0000 mg | Freq: Once | ORAL | Status: AC | PRN

## 2016-06-18 MED ORDER — ACETAMINOPHEN 325 MG PO TABS
650.0000 mg | ORAL_TABLET | Freq: Four times a day (QID) | ORAL | Status: DC | PRN
  Filled 2016-06-18: qty 2

## 2016-06-18 MED ORDER — FENTANYL CITRATE (PF) 100 MCG/2ML IJ SOLN
25.0000 ug | INTRAMUSCULAR | Status: DC | PRN
  Administered 2016-06-18 (×3): 50 ug via INTRAVENOUS
  Filled 2016-06-18 (×3): qty 2

## 2016-06-18 MED ORDER — FAMOTIDINE IN NACL 20-0.9 MG/50ML-% IV SOLN
20.0000 mg | INTRAVENOUS | Status: DC
  Administered 2016-06-18 – 2016-06-19 (×2): 20 mg via INTRAVENOUS
  Filled 2016-06-18 (×2): qty 50

## 2016-06-18 MED ORDER — ENOXAPARIN SODIUM 40 MG/0.4ML ~~LOC~~ SOLN: 40.0000 mg | SUBCUTANEOUS | Status: DC

## 2016-06-18 MED ORDER — ACETAMINOPHEN 650 MG RE SUPP: 650.0000 mg | Freq: Four times a day (QID) | RECTAL | Status: DC | PRN

## 2016-06-18 MED ORDER — HYDROMORPHONE HCL 1 MG/ML IJ SOLN
0.5000 mg | INTRAMUSCULAR | Status: DC | PRN
  Administered 2016-06-18: 1 mg via INTRAVENOUS
  Administered 2016-06-19: 2 mg via INTRAVENOUS
  Administered 2016-06-19 (×2): 1 mg via INTRAVENOUS
  Administered 2016-06-19: 2 mg via INTRAVENOUS
  Administered 2016-06-20: 1 mg via INTRAVENOUS
  Administered 2016-06-20: 2 mg via INTRAVENOUS
  Administered 2016-06-20 (×2): 1 mg via INTRAVENOUS
  Administered 2016-06-20 – 2016-06-21 (×4): 2 mg via INTRAVENOUS
  Filled 2016-06-18: qty 2
  Filled 2016-06-18: qty 1
  Filled 2016-06-18: qty 2
  Filled 2016-06-18: qty 1
  Filled 2016-06-18: qty 2
  Filled 2016-06-18: qty 1
  Filled 2016-06-18: qty 2
  Filled 2016-06-18: qty 1
  Filled 2016-06-18: qty 2
  Filled 2016-06-18 (×2): qty 1
  Filled 2016-06-18 (×3): qty 2

## 2016-06-18 MED ORDER — DEXTROSE-NACL 5-0.9 % IV SOLN
INTRAVENOUS | Status: DC
  Administered 2016-06-18 – 2016-06-19 (×3): via INTRAVENOUS

## 2016-06-18 MED ORDER — ONDANSETRON HCL 4 MG PO TABS: 4.0000 mg | ORAL_TABLET | Freq: Four times a day (QID) | ORAL | Status: DC | PRN

## 2016-06-18 MED ORDER — CHLORHEXIDINE GLUCONATE 0.12% ORAL RINSE (MEDLINE KIT)
15.0000 mL | Freq: Two times a day (BID) | OROMUCOSAL | Status: DC
  Administered 2016-06-18 (×2): 15 mL via OROMUCOSAL

## 2016-06-18 MED ORDER — ONDANSETRON HCL 4 MG/2ML IJ SOLN: 4.0000 mg | Freq: Once | INTRAMUSCULAR | Status: DC | PRN

## 2016-06-18 MED ORDER — METOCLOPRAMIDE HCL 5 MG PO TABS: 5.0000 mg | ORAL_TABLET | Freq: Three times a day (TID) | ORAL | Status: DC | PRN

## 2016-06-18 MED ORDER — ORAL CARE MOUTH RINSE
15.0000 mL | OROMUCOSAL | Status: DC
  Administered 2016-06-18 (×7): 15 mL via OROMUCOSAL

## 2016-06-18 MED ORDER — CHLORHEXIDINE GLUCONATE 0.12% ORAL RINSE (MEDLINE KIT): 15.0000 mL | Freq: Two times a day (BID) | OROMUCOSAL | Status: DC

## 2016-06-18 MED ORDER — OXYCODONE HCL 5 MG PO TABS
5.0000 mg | ORAL_TABLET | ORAL | Status: DC | PRN
  Administered 2016-06-19 – 2016-06-20 (×5): 10 mg via ORAL
  Filled 2016-06-18 (×5): qty 2
  Filled 2016-06-18: qty 1

## 2016-06-18 MED ORDER — ORAL CARE MOUTH RINSE: 15.0000 mL | OROMUCOSAL | Status: DC

## 2016-06-18 MED ORDER — INFLUENZA VAC SPLIT QUAD 0.5 ML IM SUSY: 0.5000 mL | PREFILLED_SYRINGE | INTRAMUSCULAR | Status: DC | PRN
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
## 2016-06-18 NOTE — Brief Op Note (Signed)
06/17/2016 - 06/18/2016  12:24 AM  PATIENT:  Ennis Fortsharles D Lambe  45 y.o. male  PRE-OPERATIVE DIAGNOSIS:  Fractured OPEN Right Ankle  POST-OPERATIVE DIAGNOSIS:  Fractured OPEN Right Ankle  PROCEDURE:  Procedure(s): EXTERNAL FIXATION ANKLE (Right) IRRIGATION AND DEBRIDEMENT RIGHT OPEN ANKLE WOUND (Right)  SURGEON:  Surgeon(s) and Role:    * Samson FredericBrian Ndeye Tenorio, MD - Primary  PHYSICIAN ASSISTANT: none  ASSISTANTS: staff   ANESTHESIA:   general  EBL:  Total I/O In: 5100 [I.V.:4000; Blood:600; IV Piggyback:500] Out: 2425 [Urine:750; Emesis/NG output:300; Drains:300; Blood:1075]  BLOOD ADMINISTERED:none  DRAINS: none   LOCAL MEDICATIONS USED:  NONE  SPECIMEN:  No Specimen  DISPOSITION OF SPECIMEN:  N/A  COUNTS:  YES  TOURNIQUET:  * No tourniquets in log *  DICTATION: .Other Dictation: Dictation Number [id number]  PLAN OF CARE: Admit to inpatient   PATIENT DISPOSITION:  ICU - intubated and critically ill.   Delay start of Pharmacological VTE agent (>24hrs) due to surgical blood loss or risk of bleeding: no

## 2016-06-18 NOTE — ED Notes (Signed)
Start time at [id number].  Unable to document start time through narrator.

## 2016-06-18 NOTE — Transfer of Care (Signed)
Immediate Anesthesia Transfer of Care Note  Patient: Corey Moss  Procedure(s) Performed: Procedure(s): EXTERNAL FIXATION ANKLE (Right) IRRIGATION AND DEBRIDEMENT RIGHT OPEN ANKLE WOUND (Right)  Patient Location: SICU  Anesthesia Type:General  Level of Consciousness: Patient remains intubated per anesthesia plan  Airway & Oxygen Therapy: Patient remains intubated per anesthesia plan and Patient placed on Ventilator (see vital sign flow sheet for setting)  Post-op Assessment: Report given to RN and Post -op Vital signs reviewed and stable  Post vital signs: Reviewed and stable  Last Vitals:  Vitals:   06/17/16 2210 06/17/16 2252  BP: 115/74 119/86  Pulse: 79 80  Resp: 20 20  Temp:      Last Pain:  Vitals:   06/17/16 2020  TempSrc: Oral  PainSc:          Complications: No apparent anesthesia complications

## 2016-06-18 NOTE — Progress Notes (Signed)
Attempted to wean pt on 5/5. Pt RR < 6, increase PS to 10. Pt RR continues to remain 6-12. Trauma MD aware. OK to continue weaning if RR is >/= 6. Will attempt to extubate per MD order if pt wakes up more. RT will continue to monitor

## 2016-06-18 NOTE — Procedures (Signed)
Extubation Procedure Note  Patient Details:   Name: Corey Moss DOB: [birth year] MRN: [id number]   Airway Documentation:     Evaluation  O2 sats: stable throughout Complications: No apparent complications Patient did tolerate procedure well. Bilateral Breath Sounds: Diminished, Clear   Yes   Patient extubated at this tome per MD order. Positive cuff leak. Able to vocalize and clear secretions. No distress noted. RT to monitor as needed  Lurlean LeydenDick, Joandy Burget Bailey 06/18/2016, 3:26 PM
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
## 2016-06-18 NOTE — Anesthesia Postprocedure Evaluation (Signed)
Anesthesia Post Note  Patient: Corey FortsCharles D Moss  Procedure(s) Performed: Procedure(s) (LRB): EXTERNAL FIXATION ANKLE (Right) IRRIGATION AND DEBRIDEMENT RIGHT OPEN ANKLE WOUND (Right)  Patient location during evaluation: NICU Anesthesia Type: General Level of consciousness: sedated and patient remains intubated per anesthesia plan Pain management: pain level controlled Vital Signs Assessment: post-procedure vital signs reviewed and stable Respiratory status: patient remains intubated per anesthesia plan and patient on ventilator - see flowsheet for VS Cardiovascular status: blood pressure returned to baseline Anesthetic complications: no    Last Vitals:  Vitals:   06/17/16 2252 06/18/16 0035  BP: 119/86   Pulse: 80 90  Resp: 20 (!) 31  Temp:      Last Pain:  Vitals:   06/17/16 2020  TempSrc: Oral  PainSc:                  Shoua Ulloa COKER

## 2016-06-18 NOTE — ED Notes (Signed)
2 units of blood transfused by rapid infusion.
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
## 2016-06-18 NOTE — ED Notes (Signed)
Family at beside. Family given emotional support. 

## 2016-06-18 NOTE — ED Notes (Signed)
FAST exam negative X 4.
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
## 2016-06-19 ENCOUNTER — Encounter (HOSPITAL_COMMUNITY): Admission: EM | Disposition: A | Discharge status: Home / Self Care | Payer: Self-pay | Source: Home / Self Care

## 2016-06-19 ENCOUNTER — Inpatient Hospital Stay (HOSPITAL_COMMUNITY): Payer: Self-pay

## 2016-06-19 ENCOUNTER — Inpatient Hospital Stay (HOSPITAL_COMMUNITY): Payer: Self-pay | Admitting: Anesthesiology

## 2016-06-19 ENCOUNTER — Encounter (HOSPITAL_COMMUNITY): Payer: Self-pay | Admitting: Surgery

## 2016-06-19 DIAGNOSIS — S82871B Displaced pilon fracture of right tibia, initial encounter for open fracture type I or II: Secondary | ICD-10-CM

## 2016-06-19 HISTORY — PX: EXTERNAL FIXATION LEG: SHX1549

## 2016-06-19 LAB — CBC WITH DIFFERENTIAL/PLATELET
BASOS ABS: 0.1 10*3/uL (ref 0.0–0.1)
Basophils Relative: 1 %
EOS PCT: 4 %
Eosinophils Absolute: 0.5 10*3/uL (ref 0.0–0.7)
HCT: 27.5 % — ABNORMAL LOW (ref 39.0–52.0)
Hemoglobin: 9.5 g/dL — ABNORMAL LOW (ref 13.0–17.0)
LYMPHS ABS: 2.9 10*3/uL (ref 0.7–4.0)
LYMPHS PCT: 22 %
MCH: 30.8 pg (ref 26.0–34.0)
MCHC: 34.5 g/dL (ref 30.0–36.0)
MCV: 89.3 fL (ref 78.0–100.0)
MONO ABS: 1.4 10*3/uL — AB (ref 0.1–1.0)
Monocytes Relative: 10 %
Neutro Abs: 8.2 10*3/uL — ABNORMAL HIGH (ref 1.7–7.7)
Neutrophils Relative %: 63 %
PLATELETS: 172 10*3/uL (ref 150–400)
RBC: 3.08 MIL/uL — ABNORMAL LOW (ref 4.22–5.81)
RDW: 14.3 % (ref 11.5–15.5)
WBC: 13 10*3/uL — ABNORMAL HIGH (ref 4.0–10.5)

## 2016-06-19 LAB — BASIC METABOLIC PANEL
Anion gap: 5 (ref 5–15)
BUN: 5 mg/dL — AB (ref 6–20)
CALCIUM: 7.7 mg/dL — AB (ref 8.9–10.3)
CO2: 25 mmol/L (ref 22–32)
Chloride: 108 mmol/L (ref 101–111)
Creatinine, Ser: 0.79 mg/dL (ref 0.61–1.24)
GFR calc Af Amer: >60 mL/min (ref >60)
GLUCOSE: 122 mg/dL — AB (ref 65–99)
Potassium: 3.4 mmol/L — ABNORMAL LOW (ref 3.5–5.1)
Sodium: 138 mmol/L (ref 135–145)

## 2016-06-19 SURGERY — EXTERNAL FIXATION, LOWER EXTREMITY
Anesthesia: General | Site: Leg Lower | Laterality: Right

## 2016-06-19 MED ORDER — PROPOFOL 10 MG/ML IV BOLUS
INTRAVENOUS | Status: DC | PRN
  Administered 2016-06-19: 150 mg via INTRAVENOUS

## 2016-06-19 MED ORDER — KCL IN DEXTROSE-NACL 30-5-0.45 MEQ/L-%-% IV SOLN
INTRAVENOUS | Status: DC
  Administered 2016-06-19 – 2016-06-20 (×3): via INTRAVENOUS
  Filled 2016-06-19 (×4): qty 1000

## 2016-06-19 MED ORDER — ONDANSETRON HCL 4 MG/2ML IJ SOLN
INTRAMUSCULAR | Status: DC | PRN
  Administered 2016-06-19: 4 mg via INTRAVENOUS

## 2016-06-19 MED ORDER — HYDROMORPHONE HCL 1 MG/ML IJ SOLN
0.2500 mg | INTRAMUSCULAR | Status: DC | PRN
Start: 2016-06-19 — End: 2016-06-19
  Administered 2016-06-19 (×2): 0.5 mg via INTRAVENOUS

## 2016-06-19 MED ORDER — OXYCODONE HCL 5 MG PO TABS: 5.0000 mg | ORAL_TABLET | Freq: Once | ORAL | Status: DC | PRN

## 2016-06-19 MED ORDER — SODIUM CHLORIDE 0.9 % IR SOLN
Status: DC | PRN
Start: 2016-06-19 — End: 2016-06-19
  Administered 2016-06-19: 3000 mL

## 2016-06-19 MED ORDER — CEFAZOLIN SODIUM-DEXTROSE 2-3 GM-% IV SOLR
INTRAVENOUS | Status: DC | PRN
  Administered 2016-06-19: 2 g via INTRAVENOUS

## 2016-06-19 MED ORDER — LACTATED RINGERS IV SOLN
INTRAVENOUS | Status: DC | PRN
  Administered 2016-06-19: 19:00:00 via INTRAVENOUS

## 2016-06-19 MED ORDER — EPHEDRINE SULFATE 50 MG/ML IJ SOLN
INTRAMUSCULAR | Status: DC | PRN
  Administered 2016-06-19 (×3): 5 mg via INTRAVENOUS

## 2016-06-19 MED ORDER — ALBUTEROL SULFATE (2.5 MG/3ML) 0.083% IN NEBU
2.5000 mg | INHALATION_SOLUTION | Freq: Once | RESPIRATORY_TRACT | Status: AC
  Administered 2016-06-19: 2.5 mg via RESPIRATORY_TRACT

## 2016-06-19 MED ORDER — FENTANYL CITRATE (PF) 250 MCG/5ML IJ SOLN
INTRAMUSCULAR | Status: DC | PRN
  Administered 2016-06-19: 100 ug via INTRAVENOUS

## 2016-06-19 MED ORDER — MIDAZOLAM HCL 2 MG/2ML IJ SOLN
INTRAMUSCULAR | Status: AC
  Filled 2016-06-19: qty 2

## 2016-06-19 MED ORDER — OXYCODONE HCL 5 MG/5ML PO SOLN
5.0000 mg | Freq: Once | ORAL | Status: DC | PRN
Start: 2016-06-19 — End: 2016-06-19

## 2016-06-19 MED ORDER — ALBUTEROL SULFATE (2.5 MG/3ML) 0.083% IN NEBU
INHALATION_SOLUTION | RESPIRATORY_TRACT | Status: AC
  Administered 2016-06-19: 2.5 mg via RESPIRATORY_TRACT
  Filled 2016-06-19: qty 3

## 2016-06-19 MED ORDER — MIDAZOLAM HCL 5 MG/5ML IJ SOLN
INTRAMUSCULAR | Status: DC | PRN
  Administered 2016-06-19: 2 mg via INTRAVENOUS

## 2016-06-19 MED ORDER — CEFAZOLIN SODIUM-DEXTROSE 2-4 GM/100ML-% IV SOLN
2.0000 g | Freq: Three times a day (TID) | INTRAVENOUS | Status: DC
  Administered 2016-06-20 – 2016-06-22 (×7): 2 g via INTRAVENOUS
  Filled 2016-06-19 (×10): qty 100

## 2016-06-19 MED ORDER — 0.9 % SODIUM CHLORIDE (POUR BTL) OPTIME
TOPICAL | Status: DC | PRN
  Administered 2016-06-19: 1000 mL

## 2016-06-19 MED ORDER — HYDROMORPHONE HCL 1 MG/ML IJ SOLN
INTRAMUSCULAR | Status: AC
  Administered 2016-06-19: 0.5 mg via INTRAVENOUS
  Filled 2016-06-19: qty 1

## 2016-06-19 MED ORDER — FENTANYL CITRATE (PF) 100 MCG/2ML IJ SOLN
INTRAMUSCULAR | Status: AC
  Filled 2016-06-19: qty 2

## 2016-06-19 MED ORDER — DIPHENHYDRAMINE HCL 50 MG/ML IJ SOLN
INTRAMUSCULAR | Status: DC | PRN
  Administered 2016-06-19: 12.5 mg via INTRAVENOUS

## 2016-06-19 MED ORDER — PROPOFOL 10 MG/ML IV BOLUS
INTRAVENOUS | Status: AC
  Filled 2016-06-19: qty 40

## 2016-06-19 MED ORDER — LIDOCAINE HCL (CARDIAC) 20 MG/ML IV SOLN
INTRAVENOUS | Status: DC | PRN
  Administered 2016-06-19: 80 mg via INTRATRACHEAL

## 2016-06-19 SURGICAL SUPPLY — 44 items
BANDAGE ELASTIC 6 VELCRO ST LF (GAUZE/BANDAGES/DRESSINGS) ×4 IMPLANT
BLADE SURG 10 STRL SS (BLADE) ×2 IMPLANT
BLADE SURG 15 STRL LF DISP TIS (BLADE) IMPLANT
BLADE SURG 15 STRL SS (BLADE) ×3
BNDG COHESIVE 4X5 TAN STRL (GAUZE/BANDAGES/DRESSINGS) ×2 IMPLANT
BNDG GAUZE ELAST 4 BULKY (GAUZE/BANDAGES/DRESSINGS) ×4 IMPLANT
COVER SURGICAL LIGHT HANDLE (MISCELLANEOUS) ×3 IMPLANT
DRAPE C-ARM 42X72 X-RAY (DRAPES) ×3 IMPLANT
DRAPE ORTHO SPLIT 77X108 STRL (DRAPES) ×6
DRAPE SURG ORHT 6 SPLT 77X108 (DRAPES) ×2 IMPLANT
DRAPE U-SHAPE 47X51 STRL (DRAPES) ×3 IMPLANT
DURAPREP 26ML APPLICATOR (WOUND CARE) ×3 IMPLANT
ELECT REM PT RETURN 9FT ADLT (ELECTROSURGICAL)
ELECTRODE REM PT RTRN 9FT ADLT (ELECTROSURGICAL) IMPLANT
GAUZE SPONGE 4X4 12PLY STRL (GAUZE/BANDAGES/DRESSINGS) ×3 IMPLANT
GAUZE XEROFORM 1X8 LF (GAUZE/BANDAGES/DRESSINGS) ×2 IMPLANT
GAUZE XEROFORM 5X9 LF (GAUZE/BANDAGES/DRESSINGS) ×3 IMPLANT
GLOVE BIO SURGEON STRL SZ8 (GLOVE) ×3 IMPLANT
GLOVE ORTHO TXT STRL SZ7.5 (GLOVE) ×3 IMPLANT
GOWN STRL REUS W/ TWL LRG LVL3 (GOWN DISPOSABLE) ×1 IMPLANT
GOWN STRL REUS W/ TWL XL LVL3 (GOWN DISPOSABLE) ×4 IMPLANT
GOWN STRL REUS W/TWL LRG LVL3 (GOWN DISPOSABLE) ×3
GOWN STRL REUS W/TWL XL LVL3 (GOWN DISPOSABLE) ×12
HANDPIECE INTERPULSE COAX TIP (DISPOSABLE) ×3
KIT BASIN OR (CUSTOM PROCEDURE TRAY) ×3 IMPLANT
KIT ROOM TURNOVER OR (KITS) ×3 IMPLANT
MANIFOLD NEPTUNE II (INSTRUMENTS) ×3 IMPLANT
NS IRRIG 1000ML POUR BTL (IV SOLUTION) ×3 IMPLANT
PACK ORTHO EXTREMITY (CUSTOM PROCEDURE TRAY) ×3 IMPLANT
PAD ARMBOARD 7.5X6 YLW CONV (MISCELLANEOUS) ×6 IMPLANT
PADDING CAST ABS 6INX4YD NS (CAST SUPPLIES) ×2
PADDING CAST ABS COTTON 6X4 NS (CAST SUPPLIES) IMPLANT
PADDING CAST COTTON 6X4 STRL (CAST SUPPLIES) ×3 IMPLANT
PENCIL BUTTON HOLSTER BLD 10FT (ELECTRODE) ×2 IMPLANT
SET HNDPC FAN SPRY TIP SCT (DISPOSABLE) IMPLANT
SPONGE GAUZE 4X4 12PLY STER LF (GAUZE/BANDAGES/DRESSINGS) ×2 IMPLANT
SPONGE LAP 18X18 X RAY DECT (DISPOSABLE) ×5 IMPLANT
SUT ETHILON 2 0 FS 18 (SUTURE) ×4 IMPLANT
SUT VIC AB 2-0 CT1 27 (SUTURE)
SUT VIC AB 2-0 CT1 TAPERPNT 27 (SUTURE) IMPLANT
TOWEL OR 17X24 6PK STRL BLUE (TOWEL DISPOSABLE) ×3 IMPLANT
TOWEL OR 17X26 10 PK STRL BLUE (TOWEL DISPOSABLE) ×3 IMPLANT
UNDERPAD 30X30 (UNDERPADS AND DIAPERS) ×3 IMPLANT
WATER STERILE IRR 1000ML POUR (IV SOLUTION) ×3 IMPLANT

## 2016-06-19 NOTE — Anesthesia Postprocedure Evaluation (Signed)
Anesthesia Post Note  Patient: Corey Moss  Procedure(s) Performed: Procedure(s) (LRB): I&D RIGHT ANKLE, EX FIX ADJUSTMENT RIGHT ANKLE (Right)  Patient location during evaluation: PACU Anesthesia Type: General Level of consciousness: awake and alert Pain management: pain level controlled Vital Signs Assessment: post-procedure vital signs reviewed and stable Respiratory status: spontaneous breathing, nonlabored ventilation, respiratory function stable and patient connected to nasal cannula oxygen Cardiovascular status: blood pressure returned to baseline and stable Postop Assessment: no signs of nausea or vomiting Anesthetic complications: no    Last Vitals:  Vitals:   06/19/16 2120 06/19/16 2130  BP:    Pulse: 95   Resp: 18   Temp:  (!) 38.2 C    Last Pain:  Vitals:   06/19/16 2120  TempSrc:   PainSc: 10-Worst pain ever                 Suleiman Finigan,W. EDMOND
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
## 2016-06-19 NOTE — Anesthesia Preprocedure Evaluation (Addendum)
Anesthesia Evaluation  Patient identified by MRN, date of birth, ID band Patient awake    Reviewed: Allergy & Precautions, H&P , NPO status , Patient's Chart, lab work & pertinent test results  Airway Mallampati: II  TM Distance: >3 FB Neck ROM: Full    Dental no notable dental hx. (+) Poor Dentition, Dental Advisory Given   Pulmonary neg pulmonary ROS, Current Smoker,    Pulmonary exam normal breath sounds clear to auscultation       Cardiovascular negative cardio ROS   Rhythm:Regular Rate:Normal     Neuro/Psych negative neurological ROS  negative psych ROS   GI/Hepatic negative GI ROS, Neg liver ROS,   Endo/Other  negative endocrine ROS  Renal/GU negative Renal ROS  negative genitourinary   Musculoskeletal   Abdominal   Peds  Hematology negative hematology ROS (+)   Anesthesia Other Findings   Reproductive/Obstetrics negative OB ROS                           Anesthesia Physical Anesthesia Plan  ASA: II  Anesthesia Plan: General   Post-op Pain Management:    Induction: Intravenous  Airway Management Planned: LMA and Oral ETT  Additional Equipment:   Intra-op Plan:   Post-operative Plan: Extubation in OR  Informed Consent: I have reviewed the patients History and Physical, chart, labs and discussed the procedure including the risks, benefits and alternatives for the proposed anesthesia with the patient or authorized representative who has indicated his/her understanding and acceptance.   Dental advisory given  Plan Discussed with: CRNA, Surgeon and Anesthesiologist  Anesthesia Plan Comments:        Anesthesia Quick Evaluation

## 2016-06-19 NOTE — Transfer of Care (Signed)
Immediate Anesthesia Transfer of Care Note  Patient: Ennis FortsCharles D Pulido  Procedure(s) Performed: Procedure(s): I&D RIGHT ANKLE, EX FIX ADJUSTMENT RIGHT ANKLE (Right)  Patient Location: PACU  Anesthesia Type:General  Level of Consciousness: sedated  Airway & Oxygen Therapy: Patient Spontanous Breathing and Patient connected to face mask oxygen  Post-op Assessment: Report given to RN and Post -op Vital signs reviewed and stable  Post vital signs: Reviewed and stable  Last Vitals:  Vitals:   06/19/16 1700 06/19/16 1800  BP: 124/77 103/62  Pulse: 81 83  Resp: 14 (!) 23  Temp:      Last Pain:  Vitals:   06/19/16 1600  TempSrc:   PainSc: 8       Patients Stated Pain Goal: 3 (06/19/16 1400)  Complications: No apparent anesthesia complications

## 2016-06-19 NOTE — Anesthesia Preprocedure Evaluation (Incomplete)
Anesthesia Evaluation Anesthesia Physical Anesthesia Plan Anesthesia Quick Evaluation  

## 2016-06-19 NOTE — Brief Op Note (Signed)
06/17/2016 - 06/19/2016  8:20 PM  PATIENT:  Corey Moss  45 y.o. male  PRE-OPERATIVE DIAGNOSIS:  OPEN RIGHT PILON FRACTURE  POST-OPERATIVE DIAGNOSIS:  OPEN RIGHT PILON FRACTURE  PROCEDURE:  Procedure(s): I&D RIGHT ANKLE, EX FIX ADJUSTMENT RIGHT ANKLE (Right)  SURGEON:  Surgeon(s) and Role:    * Myrene GalasMichael Handy, MD - Assisting    * Kathryne Hitchhristopher Y Jennefer Kopp, MD - Primary  ANESTHESIA:   general  EBL:  Total I/O In: 500 [I.V.:500] Out: 10 [Blood:10]  COUNTS:  YES  TOURNIQUET:  * No tourniquets in log *  DICTATION: .Other Dictation: Dictation Number [id number]  PLAN OF CARE: Admit to inpatient   PATIENT DISPOSITION:  PACU - hemodynamically stable.   Delay start of Pharmacological VTE agent (>24hrs) due to surgical blood loss or risk of bleeding: no
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
## 2016-06-19 NOTE — Anesthesia Procedure Notes (Signed)
Procedure Name: LMA Insertion Date/Time: 06/19/2016 7:30 PM Performed by: Brien MatesMAHONY, Vermon Grays D Pre-anesthesia Checklist: Patient identified, Emergency Drugs available, Suction available, Patient being monitored and Timeout performed Patient Re-evaluated:Patient Re-evaluated prior to inductionOxygen Delivery Method: Circle system utilized Preoxygenation: Pre-oxygenation with 100% oxygen Intubation Type: IV induction Ventilation: Mask ventilation without difficulty LMA: LMA inserted LMA Size: 4.0 Number of attempts: 1 Placement Confirmation: positive ETCO2 and breath sounds checked- equal and bilateral Tube secured with: Tape Dental Injury: Teeth and Oropharynx as per pre-operative assessment

## 2016-06-20 ENCOUNTER — Inpatient Hospital Stay (HOSPITAL_COMMUNITY): Payer: Self-pay

## 2016-06-20 ENCOUNTER — Encounter (HOSPITAL_COMMUNITY): Payer: Self-pay | Admitting: Orthopaedic Surgery

## 2016-06-20 LAB — CBC WITH DIFFERENTIAL/PLATELET
BASOS ABS: 0 10*3/uL (ref 0.0–0.1)
BASOS PCT: 0 %
EOS PCT: 3 %
Eosinophils Absolute: 0.4 10*3/uL (ref 0.0–0.7)
HCT: 25.6 % — ABNORMAL LOW (ref 39.0–52.0)
Hemoglobin: 8.7 g/dL — ABNORMAL LOW (ref 13.0–17.0)
Lymphocytes Relative: 12 %
Lymphs Abs: 1.5 10*3/uL (ref 0.7–4.0)
MCH: 30.7 pg (ref 26.0–34.0)
MCHC: 34 g/dL (ref 30.0–36.0)
MCV: 90.5 fL (ref 78.0–100.0)
MONO ABS: 1.1 10*3/uL — AB (ref 0.1–1.0)
Monocytes Relative: 9 %
NEUTROS ABS: 9.5 10*3/uL — AB (ref 1.7–7.7)
Neutrophils Relative %: 76 %
PLATELETS: 155 10*3/uL (ref 150–400)
RBC: 2.83 MIL/uL — ABNORMAL LOW (ref 4.22–5.81)
RDW: 14.1 % (ref 11.5–15.5)
WBC: 12.5 10*3/uL — ABNORMAL HIGH (ref 4.0–10.5)

## 2016-06-20 LAB — BASIC METABOLIC PANEL
ANION GAP: 5 (ref 5–15)
BUN: <5 mg/dL — ABNORMAL LOW (ref 6–20)
CALCIUM: 7.9 mg/dL — AB (ref 8.9–10.3)
CO2: 25 mmol/L (ref 22–32)
Chloride: 106 mmol/L (ref 101–111)
Creatinine, Ser: 0.81 mg/dL (ref 0.61–1.24)
Glucose, Bld: 148 mg/dL — ABNORMAL HIGH (ref 65–99)
Potassium: 3.6 mmol/L (ref 3.5–5.1)
Sodium: 136 mmol/L (ref 135–145)

## 2016-06-20 MED ORDER — PANTOPRAZOLE SODIUM 40 MG PO TBEC
40.0000 mg | DELAYED_RELEASE_TABLET | Freq: Every day | ORAL | Status: DC
  Administered 2016-06-20 – 2016-06-22 (×3): 40 mg via ORAL
  Filled 2016-06-20 (×3): qty 1

## 2016-06-20 MED ORDER — ADULT MULTIVITAMIN W/MINERALS CH
1.0000 | ORAL_TABLET | Freq: Every day | ORAL | Status: DC
  Administered 2016-06-21 – 2016-06-22 (×2): 1 via ORAL
  Filled 2016-06-20 (×3): qty 1

## 2016-06-20 MED ORDER — OXYCODONE HCL 5 MG PO TABS
10.0000 mg | ORAL_TABLET | ORAL | Status: DC | PRN
  Administered 2016-06-20 – 2016-06-21 (×4): 10 mg via ORAL
  Administered 2016-06-21: 15 mg via ORAL
  Administered 2016-06-21: 10 mg via ORAL
  Administered 2016-06-22 (×3): 15 mg via ORAL
  Filled 2016-06-20 (×2): qty 2
  Filled 2016-06-20 (×3): qty 3
  Filled 2016-06-20: qty 2
  Filled 2016-06-20: qty 3
  Filled 2016-06-20 (×2): qty 2

## 2016-06-20 MED ORDER — ACETAMINOPHEN 500 MG PO TABS
1000.0000 mg | ORAL_TABLET | Freq: Four times a day (QID) | ORAL | Status: DC
  Administered 2016-06-20 (×2): 1000 mg via ORAL
  Filled 2016-06-20 (×2): qty 2

## 2016-06-20 MED ORDER — METHOCARBAMOL 500 MG PO TABS
1000.0000 mg | ORAL_TABLET | Freq: Three times a day (TID) | ORAL | Status: DC
  Administered 2016-06-20 – 2016-06-22 (×6): 1000 mg via ORAL
  Filled 2016-06-20 (×7): qty 2

## 2016-06-20 MED ORDER — KETOROLAC TROMETHAMINE 30 MG/ML IJ SOLN
30.0000 mg | Freq: Once | INTRAMUSCULAR | Status: AC
  Administered 2016-06-20: 30 mg via INTRAVENOUS
  Filled 2016-06-20: qty 1

## 2016-06-20 MED ORDER — KETOROLAC TROMETHAMINE 15 MG/ML IJ SOLN
15.0000 mg | Freq: Four times a day (QID) | INTRAMUSCULAR | Status: AC
  Administered 2016-06-20 – 2016-06-22 (×4): 15 mg via INTRAVENOUS
  Filled 2016-06-20 (×7): qty 1

## 2016-06-20 NOTE — Progress Notes (Signed)
Pt asking to leave hospital. Explained process of AMA and pt's current inability to walk with external fixator.  Pt expressed "the pain medicine is not helping me." Explained to pt that RN could give pt ordered pain medicine and discussed pain medicine regimen with pt. Pt verbalized understanding. Pt is due to void since Foley removal at 1200. Pt assisted to sit at edge of bed to attempt to use urinal. Pt verbalized that he cannot use the bathroom with anyone in the room even his wife. Pt unsuccessful with attempt to urinate.  Bladder scan revealed a max of 689 mL  in bladder. Explained to patient the procedure for in & out catheterization. Pt refused and stated "I don't have an emergent need to go," and expressed desire to try later. Will continue to monitor pt closely.
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
## 2016-06-20 NOTE — Progress Notes (Signed)
Trauma Service Note  Subjective: Patient ia awake and alert and complaining of more RLE pain.   Objective: Vital signs in last 24 hours: Temp:  [98.1 F (36.7 C)-100.8 F (38.2 C)] 99.3 F (37.4 C) (12/06 0358) Pulse Rate:  [72-105] 79 (12/06 0600) Resp:  [11-27] 15 (12/06 0600) BP: (95-124)/(49-79) 114/70 (12/06 0600) SpO2:  [89 %-100 %] 96 % (12/06 0600) Last BM Date:  (pt states he is not sure)  Intake/Output from previous day: 12/05 0701 - 12/06 0700 In: 2653.8 [I.V.:2453.8; IV Piggyback:200] Out: 3485 [Urine:3475; Blood:10] Intake/Output this shift: No intake/output data recorded.  General: No acute distress  Lungs: Clear, sats are good on [place] 2 L  Abd: Soft, good bowel sounds.  Not tender  Extremities: Good capillary refill, good sensation of the right foot.  Neuro: Intact  Lab Results: CBC   Recent Labs  06/19/16 0223 06/20/16 0002  WBC 13.0* 12.5*  HGB 9.5* 8.7*  HCT 27.5* 25.6*  PLT 172 155   BMET  Recent Labs  06/19/16 0223 06/20/16 0002  NA 138 136  K 3.4* 3.6  CL 108 106  CO2 25 25  GLUCOSE 122* 148*  BUN 5* <5*  CREATININE 0.79 0.81  CALCIUM 7.7* 7.9*   PT/INR  Recent Labs  06/17/16 2042  LABPROT 14.9  INR 1.17   ABG  Recent Labs  06/17/16 2122  PHART 7.261*  HCO3 23.5    Studies/Results: Dg Ankle Complete Right  Result Date: 06/20/2016 CLINICAL DATA:  Fracture. Adjustment of external fixation of right ankle. EXAM: DG C-ARM 61-120 MIN; RIGHT ANKLE - COMPLETE 3+ VIEW COMPARISON:  Intraoperative fluoroscopic spot images 12317 FINDINGS: External fixator partially visualized. Distal fibular and medial malleolar fractures again seen, with mild displacement. Lateral tibial plafond fracture not as well visualized fluoroscopically. Alignment grossly unchanged from prior intraoperative spot views, suboptimally assessed. Total fluoroscopy time 11 seconds. IMPRESSION: Intra procedural fluoroscopy demonstrating medial and lateral  malleolar fractures. Lateral tibial plafond fracture not as well visualized fluoroscopically. Electronically Signed   By: Rubye OaksMelanie  Ehinger M.D.   On: 06/20/2016 01:32   Dg Cerv Spine Flex&ext Only  Result Date: 06/19/2016 CLINICAL DATA:  Recent trauma with posterior neck pain, initial encounter EXAM: CERVICAL SPINE - FLEXION AND EXTENSION VIEWS ONLY COMPARISON:  06/17/2016 FINDINGS: Flexion and extension views of cervical spine again demonstrate degenerative change at C5-6 and C6-7. No significant anterolisthesis is noted. No acute fracture or acute soft tissue abnormality is seen. IMPRESSION: Degenerative change stable from the prior exam. No instability on flexion and extension is noted. Electronically Signed   By: Alcide CleverMark  Lukens M.D.   On: 06/19/2016 07:46   Dg C-arm 1-60 Min  Result Date: 06/20/2016 CLINICAL DATA:  Fracture. Adjustment of external fixation of right ankle. EXAM: DG C-ARM 61-120 MIN; RIGHT ANKLE - COMPLETE 3+ VIEW COMPARISON:  Intraoperative fluoroscopic spot images 12317 FINDINGS: External fixator partially visualized. Distal fibular and medial malleolar fractures again seen, with mild displacement. Lateral tibial plafond fracture not as well visualized fluoroscopically. Alignment grossly unchanged from prior intraoperative spot views, suboptimally assessed. Total fluoroscopy time 11 seconds. IMPRESSION: Intra procedural fluoroscopy demonstrating medial and lateral malleolar fractures. Lateral tibial plafond fracture not as well visualized fluoroscopically. Electronically Signed   By: Rubye OaksMelanie  Ehinger M.D.   On: 06/20/2016 01:32    Anti-infectives: Anti-infectives    Start     Dose/Rate Route Frequency Ordered Stop   06/20/16 0400  ceFAZolin (ANCEF) IVPB 2g/100 mL premix     2 g  200 mL/hr over 30 Minutes Intravenous Every 8 hours 06/19/16 2137     06/18/16 1400  ceFAZolin (ANCEF) IVPB 1 g/50 mL premix  Status:  Discontinued     1 g 100 mL/hr over 30 Minutes Intravenous Every 8  hours 06/18/16 1322 06/19/16 2204   06/18/16 0600  ceFAZolin (ANCEF) IVPB 1 g/50 mL premix  Status:  Discontinued     1 g 100 mL/hr over 30 Minutes Intravenous Every 8 hours 06/18/16 0047 06/18/16 1322   06/17/16 2030  ceFAZolin (ANCEF) IVPB 2g/100 mL premix     2 g 200 mL/hr over 30 Minutes Intravenous  Once 06/17/16 2022 06/18/16 0609      Assessment/Plan: s/p Procedure(s): I&D RIGHT ANKLE, EX FIX ADJUSTMENT RIGHT ANKLE d/c foley Decrease IVFs  Transfer to the floor.  LOS: 3 days   Marta LamasJames O. Gae BonWyatt, III, MD, FACS [phone] Trauma Surgeon 06/20/2016
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
## 2016-06-21 LAB — CBC WITH DIFFERENTIAL/PLATELET
Basophils Absolute: 0 10*3/uL (ref 0.0–0.1)
Basophils Relative: 1 %
EOS ABS: 0.5 10*3/uL (ref 0.0–0.7)
Eosinophils Relative: 7 %
HEMATOCRIT: 24.6 % — AB (ref 39.0–52.0)
HEMOGLOBIN: 8.4 g/dL — AB (ref 13.0–17.0)
LYMPHS ABS: 1.9 10*3/uL (ref 0.7–4.0)
LYMPHS PCT: 27 %
MCH: 30.5 pg (ref 26.0–34.0)
MCHC: 34.1 g/dL (ref 30.0–36.0)
MCV: 89.5 fL (ref 78.0–100.0)
MONOS PCT: 11 %
Monocytes Absolute: 0.7 10*3/uL (ref 0.1–1.0)
NEUTROS ABS: 3.7 10*3/uL (ref 1.7–7.7)
NEUTROS PCT: 54 %
Platelets: 189 10*3/uL (ref 150–400)
RBC: 2.75 MIL/uL — AB (ref 4.22–5.81)
RDW: 13.8 % (ref 11.5–15.5)
WBC: 6.8 10*3/uL (ref 4.0–10.5)

## 2016-06-21 LAB — BASIC METABOLIC PANEL
Anion gap: 6 (ref 5–15)
BUN: 6 mg/dL (ref 6–20)
CHLORIDE: 105 mmol/L (ref 101–111)
CO2: 28 mmol/L (ref 22–32)
Calcium: 8 mg/dL — ABNORMAL LOW (ref 8.9–10.3)
Creatinine, Ser: 0.93 mg/dL (ref 0.61–1.24)
GFR calc Af Amer: >60 mL/min (ref >60)
GFR calc non Af Amer: >60 mL/min (ref >60)
Glucose, Bld: 141 mg/dL — ABNORMAL HIGH (ref 65–99)
POTASSIUM: 3.7 mmol/L (ref 3.5–5.1)
SODIUM: 139 mmol/L (ref 135–145)

## 2016-06-21 MED ORDER — ENOXAPARIN SODIUM 40 MG/0.4ML ~~LOC~~ SOLN
40.0000 mg | SUBCUTANEOUS | Status: DC
  Administered 2016-06-21: 40 mg via SUBCUTANEOUS
  Filled 2016-06-21: qty 0.4

## 2016-06-21 MED ORDER — ACETAMINOPHEN 500 MG PO TABS
1000.0000 mg | ORAL_TABLET | Freq: Three times a day (TID) | ORAL | Status: DC
  Administered 2016-06-21 – 2016-06-22 (×2): 1000 mg via ORAL
  Filled 2016-06-21 (×2): qty 2

## 2016-06-21 MED ORDER — HYDROMORPHONE HCL 2 MG/ML IJ SOLN
0.5000 mg | INTRAMUSCULAR | Status: DC | PRN
  Administered 2016-06-21 – 2016-06-22 (×2): 2 mg via INTRAVENOUS
  Filled 2016-06-21 (×2): qty 1
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
## 2016-06-21 NOTE — Progress Notes (Signed)
Trauma Service Note  Subjective: Patient a bit frustrated and wants to go home.  Has not transferred out because of lack of inpatient beds.  Physical therapy and OT did not see the patient yesterday.  Objective: Vital signs in last 24 hours: Temp:  [97.7 F (36.5 C)-98.6 F (37 C)] 97.8 F (36.6 C) (12/07 0400) Pulse Rate:  [71-83] 80 (12/07 0400) Resp:  [11-20] 18 (12/07 0000) BP: (96-112)/(62-74) 108/72 (12/07 0400) SpO2:  [94 %-100 %] 100 % (12/07 0400) Last BM Date:  (pt states he is not sure)  Intake/Output from previous day: 12/06 0701 - 12/07 0700 In: 2304.5 [P.O.:1680; I.V.:324.5; IV Piggyback:300] Out: 2200 [Urine:2200] Intake/Output this shift: No intake/output data recorded.  General: No acute distress.  Lungs: Clear to auscultation  Abd: soft, not tender, good bowel sounds and having bowel movements  Extremities: Good capillary refill,   Neuro: intact  Lab Results: CBC   Recent Labs  06/20/16 0002 06/21/16 0223  WBC 12.5* 6.8  HGB 8.7* 8.4*  HCT 25.6* 24.6*  PLT 155 189   BMET  Recent Labs  06/20/16 0002 06/21/16 0223  NA 136 139  K 3.6 3.7  CL 106 105  CO2 25 28  GLUCOSE 148* 141*  BUN <5* 6  CREATININE 0.81 0.93  CALCIUM 7.9* 8.0*   PT/INR No results for input(s): LABPROT, INR in the last 72 hours. ABG No results for input(s): PHART, HCO3 in the last 72 hours.  Invalid input(s): PCO2, PO2  Studies/Results: Dg Ankle Complete Right  Result Date: 06/20/2016 CLINICAL DATA:  Fracture. Adjustment of external fixation of right ankle. EXAM: DG C-ARM 61-120 MIN; RIGHT ANKLE - COMPLETE 3+ VIEW COMPARISON:  Intraoperative fluoroscopic spot images 12317 FINDINGS: External fixator partially visualized. Distal fibular and medial malleolar fractures again seen, with mild displacement. Lateral tibial plafond fracture not as well visualized fluoroscopically. Alignment grossly unchanged from prior intraoperative spot views, suboptimally assessed.  Total fluoroscopy time 11 seconds. IMPRESSION: Intra procedural fluoroscopy demonstrating medial and lateral malleolar fractures. Lateral tibial plafond fracture not as well visualized fluoroscopically. Electronically Signed   By: Rubye OaksMelanie  Ehinger M.D.   On: 06/20/2016 01:32   Ct Ankle Right Wo Contrast  Result Date: 06/20/2016 CLINICAL DATA:  Right ankle fracture. EXAM: CT OF THE RIGHT ANKLE WITHOUT CONTRAST TECHNIQUE: Multidetector CT imaging of the right ankle was performed according to the standard protocol. Multiplanar CT image reconstructions were also generated. COMPARISON:  None. FINDINGS: Bones/Joint/Cartilage Ex-fix device in the mid tibial diaphysis. Medial malleolar fracture with 10 mm anterior displacement. Fracture of the anterior lateral tibial plafond with 5 mm of anterior displacement and 6 mm of lateral displacement. Small amount of soft tissue emphysema adjacent to the medial malleolus likely secondary to instrumentation. Comminuted fracture of the posterior lateral talar dome. Severely comminuted lateral malleolar fracture with 10 mm of distraction between the major fracture fragments. No other fracture or dislocation. Ankle mortise is congruent. Subtalar joints are normal. Mild osteoarthritis of the first MTP joint. No aggressive lytic or sclerotic osseous lesion. No periosteal reaction or bone destruction. Ligaments Suboptimally assessed by CT. Muscles and Tendons Muscles are normal. No significant muscle atrophy. Flexor, extensor and peroneal tendons are intact. Achilles tendon is intact. Soft tissues Soft tissue edema circumferentially around the ankle. No fluid collection or hematoma. IMPRESSION: 1. Medial malleolar fracture with 10 mm anterior displacement. Fracture of the anterior lateral tibial plafond with 5 mm of anterior displacement and 6 mm of lateral displacement. Small amount of soft tissue  emphysema adjacent to the medial malleolus likely secondary to instrumentation. 2.  Comminuted fracture of the posterior lateral talar dome. 3. Severely comminuted lateral malleolar fracture with 10 mm of distraction between the major fracture fragments. Electronically Signed   By: Elige KoHetal  Patel   On: 06/20/2016 09:43   Dg C-arm 1-60 Min  Result Date: 06/20/2016 CLINICAL DATA:  Fracture. Adjustment of external fixation of right ankle. EXAM: DG C-ARM 61-120 MIN; RIGHT ANKLE - COMPLETE 3+ VIEW COMPARISON:  Intraoperative fluoroscopic spot images [id number] FINDINGS: External fixator partially visualized. Distal fibular and medial malleolar fractures again seen, with mild displacement. Lateral tibial plafond fracture not as well visualized fluoroscopically. Alignment grossly unchanged from prior intraoperative spot views, suboptimally assessed. Total fluoroscopy time 11 seconds. IMPRESSION: Intra procedural fluoroscopy demonstrating medial and lateral malleolar fractures. Lateral tibial plafond fracture not as well visualized fluoroscopically. Electronically Signed   By: Rubye OaksMelanie  Ehinger M.D.   On: 06/20/2016 01:32    Anti-infectives: Anti-infectives    Start     Dose/Rate Route Frequency Ordered Stop   06/20/16 0400  ceFAZolin (ANCEF) IVPB 2g/100 mL premix     2 g 200 mL/hr over 30 Minutes Intravenous Every 8 hours 06/19/16 2137     06/18/16 1400  ceFAZolin (ANCEF) IVPB 1 g/50 mL premix  Status:  Discontinued     1 g 100 mL/hr over 30 Minutes Intravenous Every 8 hours 06/18/16 1322 06/19/16 2204   06/18/16 0600  ceFAZolin (ANCEF) IVPB 1 g/50 mL premix  Status:  Discontinued     1 g 100 mL/hr over 30 Minutes Intravenous Every 8 hours 06/18/16 0047 06/18/16 1322   06/17/16 2030  ceFAZolin (ANCEF) IVPB 2g/100 mL premix     2 g 200 mL/hr over 30 Minutes Intravenous  Once 06/17/16 2022 06/18/16 [id number]      Assessment/Plan: s/p Procedure(s): I&D RIGHT ANKLE, EX FIX ADJUSTMENT RIGHT ANKLE Evaluation by PT/OT  Develop plan for disposition  LOS: 4 days   Marta LamasJames O. Gae BonWyatt, III, MD,  FACS [phone] Trauma Surgeon 06/21/2016

## 2016-06-21 NOTE — Progress Notes (Signed)
Transferred to [id number] via wheelchair. Portable monitor on. No changes.

## 2016-06-22 DIAGNOSIS — J9600 Acute respiratory failure, unspecified whether with hypoxia or hypercapnia: Secondary | ICD-10-CM | POA: Diagnosis present

## 2016-06-22 DIAGNOSIS — D62 Acute posthemorrhagic anemia: Secondary | ICD-10-CM | POA: Diagnosis present

## 2016-06-22 DIAGNOSIS — S0101XA Laceration without foreign body of scalp, initial encounter: Secondary | ICD-10-CM | POA: Diagnosis present

## 2016-06-22 DIAGNOSIS — R571 Hypovolemic shock: Secondary | ICD-10-CM | POA: Diagnosis present

## 2016-06-22 MED ORDER — METHOCARBAMOL 500 MG PO TABS: 1000.0000 mg | ORAL_TABLET | Freq: Four times a day (QID) | ORAL | 0 refills | Status: DC | PRN

## 2016-06-22 MED ORDER — CEPHALEXIN 250 MG PO CAPS: 250.0000 mg | ORAL_CAPSULE | Freq: Four times a day (QID) | ORAL | 0 refills | Status: AC

## 2016-06-22 MED ORDER — ASPIRIN EC 325 MG PO TBEC: 325.0000 mg | DELAYED_RELEASE_TABLET | Freq: Two times a day (BID) | ORAL | 0 refills | Status: DC

## 2016-06-22 MED ORDER — OXYCODONE-ACETAMINOPHEN 10-325 MG PO TABS: 1.0000 | ORAL_TABLET | ORAL | 0 refills | Status: DC | PRN

## 2016-06-22 MED FILL — CEPHALEXIN 250 MG CAPSULE: 250 | 10 days supply | Qty: 40 | Fill #0

## 2016-06-22 MED FILL — METHOCARBAMOL 500 MG TABLET: 500 | 13 days supply | Qty: 100 | Fill #0

## 2016-06-22 MED FILL — OXYCODONE-APAP 10-325: 10-325 | 5 days supply | Qty: 60 | Fill #0

## 2016-06-22 NOTE — Progress Notes (Signed)
D/C papers gone over with pt. Reinforced NWB status and pt. States he understands. Encouraged pt. To use ice on right ankle and to keep right leg elevated. Pin site care gone over with pt. And his wife and they both state understanding and that they need to do it daily. Supplies to do pin site care were given to pt. And his wife. IV taken out. Waiting for walker to be delivered and then pt. Can be d/c'd.

## 2016-06-22 NOTE — Progress Notes (Signed)
Per pt. , ortho changed compression wrap dressing this morning. I continue to teach pt. About NWB on right leg, but he continues to put some weight on it when moving around. Pt. Is not compliant with NWB status.
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
## 2016-06-22 NOTE — Progress Notes (Signed)
Walker delivered. Pt. D/c'd successfully with wife via w/c.

## 2016-06-22 NOTE — Care Management Note (Signed)
Case Management Note  Patient Details  Name: Corey Moss MRN: [id number] Date of Birth: 08/09/1970  Subjective/Objective:   Pt medically stable for discharge home with wife.  PT/OT recommending no OP follow up, DME.  Pt is uninsured, but is eligible for medication assistance through [facility] program.                   Action/Plan: Met with pt to finalize dc plans.  He is very anxious, wanting to leave.  Referral to [facility] for RW through their charity program; pt declines need for 3 in 1.  [facility] letter given with explanation of program benefits.    Expected Discharge Date:   06/22/16               Expected Discharge Plan:  Home/Self Care  In-House Referral:     Discharge planning Services  CM Consult, [facility]  Post Acute Care Choice:    Choice offered to:     DME Arranged:  Walker rolling DME Agency:  [place]:    [facility] Agency:     Status of Service:  Completed, signed off  If discussed at [place] of Stay Meetings, dates discussed:    Additional Comments:  Reinaldo Raddle, RN, BSN  Trauma/Neuro ICU Case Manager [phone]
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
## 2016-06-22 NOTE — Discharge Instructions (Signed)
[facility] Discharge Instructions   General Discharge Instructions  WEIGHT BEARING STATUS: Nonweightbearing Right leg   RANGE OF MOTION/ACTIVITY: ok to move toes as much as tolerated, ok to move knee. Keep right leg elevated above heart as much as possible for swelling control. Ice to right ankle as well for pain and swelling control  Wound Care:  We will change dressing at office follow up on Monday 06/25/2016. Pin care and wound care instructions are below  Discharge Wound Care Instructions- for right ankle  Do NOT apply any ointments, solutions or lotions to pin sites or surgical wounds.  These prevent needed drainage and even though solutions like hydrogen peroxide kill bacteria, they also damage cells lining the pin sites that help fight infection.  Applying lotions or ointments can keep the wounds moist and can cause them to breakdown and open up as well. This can increase the risk for infection. When in doubt call the office.  Surgical incisions should be dressed daily.  If any drainage is noted, use one layer of adaptic, then gauze, Kerlix, and an ace wrap.  Once the incision is completely dry and without drainage, it may be left open to air out.  Showering may begin 36-48 hours later.  Cleaning gently with soap and water.  Traumatic wounds should be dressed daily as well.    One layer of adaptic, gauze, Kerlix, then ace wrap.  The adaptic can be discontinued once the draining has ceased    If you have a wet to dry dressing: wet the gauze with saline the squeeze as much saline out so the gauze is moist (not soaking wet), place moistened gauze over wound, then place a dry gauze over the moist one, followed by Kerlix wrap, then ace wrap.  Discharge Pin Site Instructions  Dress pins daily with Kerlix roll starting on POD 2. Wrap the Kerlix so that it tamps the skin down around the pin-skin interface to prevent/limit motion of the skin relative to the pin.  (Pin-skin  motion is the primary cause of pain and infection related to external fixator pin sites).  Remove any crust or coagulum that may obstruct drainage with a saline moistened gauze or soap and water.  After POD 3, if there is no discernable drainage on the pin site dressing, the interval for change can by increased to every other day.  You may shower with the fixator, cleaning all pin sites gently with soap and water.  If you have a surgical wound this needs to be completely dry and without drainage before showering.  The extremity can be lifted by the fixator to facilitate wound care and transfers.  Notify the office/Doctor if you experience increasing drainage, redness, or pain from a pin site, or if you notice purulent (thick, snot-like) drainage.    PAIN MEDICATION USE AND EXPECTATIONS  You have likely been given narcotic medications to help control your pain.  After a traumatic event that results in an fracture (broken bone) with or without surgery, it is ok to use narcotic pain medications to help control one's pain.  We understand that everyone responds to pain differently and each individual patient will be evaluated on a regular basis for the continued need for narcotic medications. Ideally, narcotic medication use should last no more than 6-8 weeks (coinciding with fracture healing).   As a patient it is your responsibility as well to monitor narcotic medication use and report the amount and frequency you use these medications when you  come to your office visit.   We would also advise that if you are using narcotic medications, you should take a dose prior to therapy to maximize you participation.  IF YOU ARE ON NARCOTIC MEDICATIONS IT IS NOT PERMISSIBLE TO OPERATE A MOTOR VEHICLE (MOTORCYCLE/CAR/TRUCK/MOPED) OR HEAVY MACHINERY DO NOT MIX NARCOTICS WITH OTHER CNS (CENTRAL NERVOUS SYSTEM) DEPRESSANTS SUCH AS ALCOHOL  Diet: as you were eating previously.  Can use over the counter stool  softeners and bowel preparations, such as Miralax, to help with bowel movements.  Narcotics can be constipating.  Be sure to drink plenty of fluids    STOP SMOKING OR USING NICOTINE PRODUCTS!!!!  As discussed nicotine severely impairs your body's ability to heal surgical and traumatic wounds but also impairs bone healing.  Wounds and bone heal by forming microscopic blood vessels (angiogenesis) and nicotine is a vasoconstrictor (essentially, shrinks blood vessels).  Therefore, if vasoconstriction occurs to these microscopic blood vessels they essentially disappear and are unable to deliver necessary nutrients to the healing tissue.  This is one modifiable factor that you can do to dramatically increase your chances of healing your injury.    (This means no smoking, no nicotine gum, patches, etc)  DO NOT USE NONSTEROIDAL ANTI-INFLAMMATORY DRUGS (NSAID'S)  Using products such as Advil (ibuprofen), Aleve (naproxen), Motrin (ibuprofen) for additional pain control during fracture healing can delay and/or prevent the healing response.  If you would like to take over the counter (OTC) medication, Tylenol (acetaminophen) is ok.  However, some narcotic medications that are given for pain control contain acetaminophen as well. Therefore, you should not exceed more than 4000 mg of tylenol in a day if you do not have liver disease.  Also note that there are may OTC medicines, such as cold medicines and allergy medicines that my contain tylenol as well.  If you have any questions about medications and/or interactions please ask your doctor/PA or your pharmacist.      ICE AND ELEVATE INJURED/OPERATIVE EXTREMITY  Using ice and elevating the injured extremity above your heart can help with swelling and pain control.  Icing in a pulsatile fashion, such as 20 minutes on and 20 minutes off, can be followed.    Do not place ice directly on skin. Make sure there is a barrier between to skin and the ice pack.    Using  frozen items such as frozen peas works well as the conform nicely to the are that needs to be iced.  USE AN ACE WRAP OR TED HOSE FOR SWELLING CONTROL  In addition to icing and elevation, Ace wraps or TED hose are used to help limit and resolve swelling.  It is recommended to use Ace wraps or TED hose until you are informed to stop.    When using Ace Wraps start the wrapping distally (farthest away from the body) and wrap proximally (closer to the body)   Example: If you had surgery on your leg or thing and you do not have a splint on, start the ace wrap at the toes and work your way up to the thigh        If you had surgery on your upper extremity and do not have a splint on, start the ace wrap at your fingers and work your way up to the upper arm  IF YOU ARE IN A SPLINT OR CAST DO NOT REMOVE IT FOR ANY REASON   If your splint gets wet for any reason please contact the office  immediately. You may shower in your splint or cast as long as you keep it dry.  This can be done by wrapping in a cast cover or garbage back (or similar)  Do Not stick any thing down your splint or cast such as pencils, money, or hangers to try and scratch yourself with.  If you feel itchy take benadryl as prescribed on the bottle for itching  IF YOU ARE IN A CAM BOOT (BLACK BOOT)  You may remove boot periodically. Perform daily dressing changes as noted below.  Wash the liner of the boot regularly and wear a sock when wearing the boot. It is recommended that you sleep in the boot until told otherwise  CALL THE OFFICE WITH ANY QUESTIONS OR CONCERNS: [phone]      Wash scalp wound daily with soap and water. Apply antibiotic ointment (e.g. Neosporin) twice daily and as needed to keep moist.

## 2016-06-25 ENCOUNTER — Encounter (HOSPITAL_COMMUNITY): Payer: Self-pay | Admitting: Emergency Medicine

## 2016-06-27 MED FILL — OXYCODONE-APAP 10-325: 10-325 | 11 days supply | Qty: 90 | Fill #0

## 2016-06-28 ENCOUNTER — Telehealth (HOSPITAL_COMMUNITY): Payer: Self-pay

## 2016-06-28 NOTE — Telephone Encounter (Signed)
Will have [facility] call to set up nurse visit to remove staples.

## 2016-07-19 ENCOUNTER — Encounter (HOSPITAL_COMMUNITY): Payer: Self-pay | Admitting: *Deleted

## 2016-07-19 MED ORDER — DEXTROSE 5 % IV SOLN
3.0000 g | INTRAVENOUS | Status: AC
  Administered 2016-07-20: 3 g via INTRAVENOUS
  Filled 2016-07-19: qty 3000

## 2016-07-19 NOTE — Progress Notes (Signed)
Spoke with pt for pre-op call. Pt denies cardiac history, chest pain or sob. 

## 2016-07-20 ENCOUNTER — Inpatient Hospital Stay (HOSPITAL_COMMUNITY): Payer: No Typology Code available for payment source | Admitting: Anesthesiology

## 2016-07-20 ENCOUNTER — Encounter (HOSPITAL_COMMUNITY): Payer: Self-pay | Admitting: *Deleted

## 2016-07-20 ENCOUNTER — Ambulatory Visit (HOSPITAL_COMMUNITY): Payer: No Typology Code available for payment source

## 2016-07-20 ENCOUNTER — Ambulatory Visit (HOSPITAL_COMMUNITY)
Admission: RE | Admit: 2016-07-20 | Discharge: 2016-07-20 | Disposition: A | Discharge status: Home / Self Care | Payer: No Typology Code available for payment source | Source: Ambulatory Visit | Attending: Orthopedic Surgery | Admitting: Orthopedic Surgery

## 2016-07-20 ENCOUNTER — Encounter (HOSPITAL_COMMUNITY)
Admission: RE | Disposition: A | Discharge status: Home / Self Care | Payer: Self-pay | Source: Ambulatory Visit | Attending: Orthopedic Surgery

## 2016-07-20 DIAGNOSIS — M868X7 Other osteomyelitis, ankle and foot: Secondary | ICD-10-CM | POA: Insufficient documentation

## 2016-07-20 DIAGNOSIS — S82871C Displaced pilon fracture of right tibia, initial encounter for open fracture type IIIA, IIIB, or IIIC: Secondary | ICD-10-CM | POA: Diagnosis present

## 2016-07-20 DIAGNOSIS — S82491C Other fracture of shaft of right fibula, initial encounter for open fracture type IIIA, IIIB, or IIIC: Secondary | ICD-10-CM | POA: Insufficient documentation

## 2016-07-20 DIAGNOSIS — S9301XA Subluxation of right ankle joint, initial encounter: Secondary | ICD-10-CM | POA: Insufficient documentation

## 2016-07-20 DIAGNOSIS — Z87820 Personal history of traumatic brain injury: Secondary | ICD-10-CM | POA: Diagnosis not present

## 2016-07-20 DIAGNOSIS — L97819 Non-pressure chronic ulcer of other part of right lower leg with unspecified severity: Secondary | ICD-10-CM | POA: Diagnosis not present

## 2016-07-20 DIAGNOSIS — Z419 Encounter for procedure for purposes other than remedying health state, unspecified: Secondary | ICD-10-CM

## 2016-07-20 DIAGNOSIS — Z7982 Long term (current) use of aspirin: Secondary | ICD-10-CM | POA: Diagnosis not present

## 2016-07-20 DIAGNOSIS — F17210 Nicotine dependence, cigarettes, uncomplicated: Secondary | ICD-10-CM | POA: Diagnosis not present

## 2016-07-20 DIAGNOSIS — T148XXA Other injury of unspecified body region, initial encounter: Secondary | ICD-10-CM

## 2016-07-20 HISTORY — PX: EXTERNAL FIXATION REMOVAL: SHX5040

## 2016-07-20 HISTORY — PX: BONE EXCISION: SHX6730

## 2016-07-20 HISTORY — PX: ORIF ANKLE FRACTURE: SHX5408

## 2016-07-20 LAB — PROTIME-INR
INR: 1.05
Prothrombin Time: 13.7 seconds (ref 11.4–15.2)

## 2016-07-20 LAB — COMPREHENSIVE METABOLIC PANEL
ALBUMIN: 3.8 g/dL (ref 3.5–5.0)
ALT: 18 U/L (ref 17–63)
ANION GAP: 9 (ref 5–15)
AST: 15 U/L (ref 15–41)
Alkaline Phosphatase: 124 U/L (ref 38–126)
BUN: 8 mg/dL (ref 6–20)
CHLORIDE: 106 mmol/L (ref 101–111)
CO2: 25 mmol/L (ref 22–32)
Calcium: 10 mg/dL (ref 8.9–10.3)
Creatinine, Ser: 1.04 mg/dL (ref 0.61–1.24)
GFR calc Af Amer: >60 mL/min (ref >60)
Glucose, Bld: 115 mg/dL — ABNORMAL HIGH (ref 65–99)
POTASSIUM: 4.5 mmol/L (ref 3.5–5.1)
Sodium: 140 mmol/L (ref 135–145)
Total Bilirubin: 0.3 mg/dL (ref 0.3–1.2)
Total Protein: 8.3 g/dL — ABNORMAL HIGH (ref 6.5–8.1)

## 2016-07-20 LAB — CBC WITH DIFFERENTIAL/PLATELET
BASOS ABS: 0.1 10*3/uL (ref 0.0–0.1)
BASOS PCT: 1 %
EOS PCT: 2 %
Eosinophils Absolute: 0.3 10*3/uL (ref 0.0–0.7)
HCT: 44.8 % (ref 39.0–52.0)
Hemoglobin: 15 g/dL (ref 13.0–17.0)
Lymphocytes Relative: 26 %
Lymphs Abs: 3.3 10*3/uL (ref 0.7–4.0)
MCH: 28.9 pg (ref 26.0–34.0)
MCHC: 33.5 g/dL (ref 30.0–36.0)
MCV: 86.3 fL (ref 78.0–100.0)
MONOS PCT: 9 %
Monocytes Absolute: 1.1 10*3/uL — ABNORMAL HIGH (ref 0.1–1.0)
Neutro Abs: 7.9 10*3/uL — ABNORMAL HIGH (ref 1.7–7.7)
Neutrophils Relative %: 62 %
PLATELETS: 348 10*3/uL (ref 150–400)
RBC: 5.19 MIL/uL (ref 4.22–5.81)
RDW: 13.3 % (ref 11.5–15.5)
WBC: 12.6 10*3/uL — ABNORMAL HIGH (ref 4.0–10.5)

## 2016-07-20 LAB — APTT: aPTT: 33 seconds (ref 24–36)

## 2016-07-20 SURGERY — OPEN REDUCTION INTERNAL FIXATION (ORIF) ANKLE FRACTURE
Anesthesia: General | Laterality: Right

## 2016-07-20 MED ORDER — OXYCODONE-ACETAMINOPHEN 10-325 MG PO TABS: 1.0000 | ORAL_TABLET | Freq: Four times a day (QID) | ORAL | 0 refills | Status: DC | PRN

## 2016-07-20 MED ORDER — HYDROMORPHONE HCL 1 MG/ML IJ SOLN
INTRAMUSCULAR | Status: AC
  Filled 2016-07-20: qty 1

## 2016-07-20 MED ORDER — LIDOCAINE 2% (20 MG/ML) 5 ML SYRINGE
INTRAMUSCULAR | Status: AC
  Filled 2016-07-20: qty 10

## 2016-07-20 MED ORDER — DEXAMETHASONE SODIUM PHOSPHATE 10 MG/ML IJ SOLN
INTRAMUSCULAR | Status: AC
  Filled 2016-07-20: qty 1

## 2016-07-20 MED ORDER — KETOROLAC TROMETHAMINE 30 MG/ML IJ SOLN
INTRAMUSCULAR | Status: DC | PRN
  Administered 2016-07-20: 30 mg via INTRAVENOUS

## 2016-07-20 MED ORDER — LIDOCAINE HCL (CARDIAC) 20 MG/ML IV SOLN
INTRAVENOUS | Status: DC | PRN
  Administered 2016-07-20: 40 mg via INTRAVENOUS
  Administered 2016-07-20: 60 mg via INTRAVENOUS

## 2016-07-20 MED ORDER — LACTATED RINGERS IV SOLN
INTRAVENOUS | Status: DC | PRN
  Administered 2016-07-20 (×3): via INTRAVENOUS

## 2016-07-20 MED ORDER — VANCOMYCIN HCL 500 MG IV SOLR
INTRAVENOUS | Status: DC | PRN
  Administered 2016-07-20: 500 mg

## 2016-07-20 MED ORDER — PHENYLEPHRINE HCL 10 MG/ML IJ SOLN
INTRAVENOUS | Status: DC | PRN
  Administered 2016-07-20: 10 ug/min via INTRAVENOUS

## 2016-07-20 MED ORDER — METOCLOPRAMIDE HCL 5 MG/ML IJ SOLN
INTRAMUSCULAR | Status: DC | PRN
  Administered 2016-07-20: 10 mg via INTRAVENOUS

## 2016-07-20 MED ORDER — ARTIFICIAL TEARS OP OINT
TOPICAL_OINTMENT | OPHTHALMIC | Status: DC | PRN
  Administered 2016-07-20: 1 via OPHTHALMIC

## 2016-07-20 MED ORDER — OXYCODONE-ACETAMINOPHEN 10-325 MG PO TABS: 1.0000 | ORAL_TABLET | ORAL | 0 refills | Status: DC | PRN

## 2016-07-20 MED ORDER — MIDAZOLAM HCL 2 MG/2ML IJ SOLN
INTRAMUSCULAR | Status: AC
  Administered 2016-07-20: 2 mg
  Filled 2016-07-20: qty 2

## 2016-07-20 MED ORDER — ARTIFICIAL TEARS OP OINT
TOPICAL_OINTMENT | OPHTHALMIC | Status: AC
  Filled 2016-07-20: qty 3.5

## 2016-07-20 MED ORDER — VANCOMYCIN HCL 500 MG IV SOLR
INTRAVENOUS | Status: AC
  Filled 2016-07-20: qty 500

## 2016-07-20 MED ORDER — KETOROLAC TROMETHAMINE 30 MG/ML IJ SOLN: 30.0000 mg | Freq: Once | INTRAMUSCULAR | Status: DC | PRN

## 2016-07-20 MED ORDER — LACTATED RINGERS IV SOLN
INTRAVENOUS | Status: DC
  Administered 2016-07-20: 09:00:00 via INTRAVENOUS

## 2016-07-20 MED ORDER — KETOROLAC TROMETHAMINE 30 MG/ML IJ SOLN
INTRAMUSCULAR | Status: AC
  Filled 2016-07-20: qty 2

## 2016-07-20 MED ORDER — DEXAMETHASONE SODIUM PHOSPHATE 10 MG/ML IJ SOLN
INTRAMUSCULAR | Status: DC | PRN
  Administered 2016-07-20: 10 mg via INTRAVENOUS

## 2016-07-20 MED ORDER — HYDROMORPHONE HCL 1 MG/ML IJ SOLN
INTRAMUSCULAR | Status: DC | PRN
  Administered 2016-07-20 (×2): 0.5 mg via INTRAVENOUS

## 2016-07-20 MED ORDER — DEXTROSE 5 % IV SOLN
INTRAVENOUS | Status: DC | PRN
  Administered 2016-07-20: 10:00:00 via INTRAVENOUS

## 2016-07-20 MED ORDER — ROCURONIUM BROMIDE 100 MG/10ML IV SOLN
INTRAVENOUS | Status: DC | PRN
  Administered 2016-07-20: 40 mg via INTRAVENOUS
  Administered 2016-07-20: 60 mg via INTRAVENOUS

## 2016-07-20 MED ORDER — KETAMINE HCL 10 MG/ML IJ SOLN
INTRAMUSCULAR | Status: AC
Start: 2016-07-20 — End: 2016-07-20
  Filled 2016-07-20: qty 1

## 2016-07-20 MED ORDER — HYDROMORPHONE HCL 1 MG/ML IJ SOLN: 0.2500 mg | INTRAMUSCULAR | Status: DC | PRN

## 2016-07-20 MED ORDER — SUGAMMADEX SODIUM 200 MG/2ML IV SOLN
INTRAVENOUS | Status: DC | PRN
  Administered 2016-07-20: 200 mg via INTRAVENOUS

## 2016-07-20 MED ORDER — CIPROFLOXACIN HCL 500 MG PO TABS: 500.0000 mg | ORAL_TABLET | Freq: Two times a day (BID) | ORAL | 0 refills | Status: DC

## 2016-07-20 MED ORDER — FENTANYL CITRATE (PF) 100 MCG/2ML IJ SOLN
INTRAMUSCULAR | Status: DC | PRN
  Administered 2016-07-20 (×4): 50 ug via INTRAVENOUS

## 2016-07-20 MED ORDER — PROMETHAZINE HCL 12.5 MG PO TABS: 12.5000 mg | ORAL_TABLET | Freq: Four times a day (QID) | ORAL | 0 refills | Status: DC | PRN

## 2016-07-20 MED ORDER — METOCLOPRAMIDE HCL 5 MG/ML IJ SOLN
INTRAMUSCULAR | Status: AC
  Filled 2016-07-20: qty 2

## 2016-07-20 MED ORDER — TOBRAMYCIN SULFATE 80 MG/2ML IJ SOLN
INTRAMUSCULAR | Status: AC
  Filled 2016-07-20: qty 4

## 2016-07-20 MED ORDER — ACETAMINOPHEN 500 MG PO TABS
1000.0000 mg | ORAL_TABLET | Freq: Once | ORAL | Status: AC
  Administered 2016-07-20: 1000 mg via ORAL
  Filled 2016-07-20: qty 2

## 2016-07-20 MED ORDER — FENTANYL CITRATE (PF) 100 MCG/2ML IJ SOLN
INTRAMUSCULAR | Status: AC
  Administered 2016-07-20: 100 ug
  Filled 2016-07-20: qty 2

## 2016-07-20 MED ORDER — FENTANYL CITRATE (PF) 100 MCG/2ML IJ SOLN
INTRAMUSCULAR | Status: AC
  Filled 2016-07-20: qty 4

## 2016-07-20 MED ORDER — METHOCARBAMOL 500 MG PO TABS: 500.0000 mg | ORAL_TABLET | Freq: Four times a day (QID) | ORAL | 0 refills | Status: DC | PRN

## 2016-07-20 MED ORDER — SUGAMMADEX SODIUM 200 MG/2ML IV SOLN
INTRAVENOUS | Status: AC
  Filled 2016-07-20: qty 2

## 2016-07-20 MED ORDER — BUPIVACAINE-EPINEPHRINE 0.5% -1:200000 IJ SOLN
INTRAMUSCULAR | Status: DC | PRN
  Administered 2016-07-20: 10 mL

## 2016-07-20 MED ORDER — BUPIVACAINE HCL (PF) 0.5 % IJ SOLN
INTRAMUSCULAR | Status: DC | PRN
  Administered 2016-07-20: 30 mL via PERINEURAL

## 2016-07-20 MED ORDER — MIDAZOLAM HCL 5 MG/5ML IJ SOLN
INTRAMUSCULAR | Status: DC | PRN
  Administered 2016-07-20: 2 mg via INTRAVENOUS

## 2016-07-20 MED ORDER — TOBRAMYCIN SULFATE 80 MG/2ML IJ SOLN
INTRAMUSCULAR | Status: DC | PRN
  Administered 2016-07-20: 120 mg via INTRAMUSCULAR

## 2016-07-20 MED ORDER — OXYCODONE HCL 5 MG PO TABS: 5.0000 mg | ORAL_TABLET | Freq: Four times a day (QID) | ORAL | 0 refills | Status: DC | PRN

## 2016-07-20 MED ORDER — PROPOFOL 10 MG/ML IV BOLUS
INTRAVENOUS | Status: DC | PRN
  Administered 2016-07-20: 200 mg via INTRAVENOUS

## 2016-07-20 MED ORDER — PROMETHAZINE HCL 25 MG/ML IJ SOLN: 6.2500 mg | INTRAMUSCULAR | Status: DC | PRN

## 2016-07-20 MED ORDER — MIDAZOLAM HCL 2 MG/2ML IJ SOLN
INTRAMUSCULAR | Status: AC
  Filled 2016-07-20: qty 2

## 2016-07-20 MED ORDER — MIDAZOLAM HCL 2 MG/2ML IJ SOLN: 2.0000 mg | Freq: Once | INTRAMUSCULAR | Status: DC

## 2016-07-20 MED ORDER — ONDANSETRON HCL 4 MG/2ML IJ SOLN
INTRAMUSCULAR | Status: DC | PRN
Start: 2016-07-20 — End: 2016-07-20
  Administered 2016-07-20: 4 mg via INTRAVENOUS

## 2016-07-20 MED ORDER — 0.9 % SODIUM CHLORIDE (POUR BTL) OPTIME
TOPICAL | Status: DC | PRN
  Administered 2016-07-20: 3000 mL

## 2016-07-20 MED ORDER — VECURONIUM BROMIDE 10 MG IV SOLR
INTRAVENOUS | Status: AC
  Filled 2016-07-20: qty 10

## 2016-07-20 MED ORDER — VECURONIUM BROMIDE 10 MG IV SOLR
INTRAVENOUS | Status: DC | PRN
  Administered 2016-07-20: 2 mg via INTRAVENOUS
  Administered 2016-07-20: 4 mg via INTRAVENOUS

## 2016-07-20 MED ORDER — FENTANYL CITRATE (PF) 100 MCG/2ML IJ SOLN: 100.0000 ug | Freq: Once | INTRAMUSCULAR | Status: DC

## 2016-07-20 MED ORDER — KETAMINE HCL 10 MG/ML IJ SOLN
INTRAMUSCULAR | Status: DC | PRN
  Administered 2016-07-20 (×5): 20 mg via INTRAVENOUS

## 2016-07-20 MED ORDER — PROPOFOL 10 MG/ML IV BOLUS
INTRAVENOUS | Status: AC
  Filled 2016-07-20: qty 20

## 2016-07-20 MED ORDER — ONDANSETRON HCL 4 MG/2ML IJ SOLN
INTRAMUSCULAR | Status: AC
  Filled 2016-07-20: qty 2

## 2016-07-20 MED ORDER — CHLORHEXIDINE GLUCONATE 4 % EX LIQD: 60.0000 mL | Freq: Once | CUTANEOUS | Status: DC

## 2016-07-20 MED ORDER — POTASSIUM CHLORIDE IN NACL 20-0.9 MEQ/L-% IV SOLN
INTRAVENOUS | Status: DC
  Filled 2016-07-20: qty 1000

## 2016-07-20 MED FILL — oxyCODONE HCL 5 MG TABS: 5 | 6 days supply | Qty: 50 | Fill #0

## 2016-07-20 MED FILL — METHOCARBAMOL 500 MG TABLET: 500 | 11 days supply | Qty: 90 | Fill #0

## 2016-07-20 MED FILL — OXYCODONE-APAP 10-325: 10-325 | 10 days supply | Qty: 80 | Fill #0

## 2016-07-20 MED FILL — CIPROFLOXACIN HCL 500 MG TA: 500 | 30 days supply | Qty: 60 | Fill #0

## 2016-07-20 MED FILL — PROMETHAZINE 12.5 MG TABLET: 12.5 | 4 days supply | Qty: 30 | Fill #0

## 2016-07-20 SURGICAL SUPPLY — 92 items
BANDAGE ACE 4X5 VEL STRL LF (GAUZE/BANDAGES/DRESSINGS) ×3 IMPLANT
BANDAGE ACE 6X5 VEL STRL LF (GAUZE/BANDAGES/DRESSINGS) ×3 IMPLANT
BANDAGE ESMARK 6X9 LF (GAUZE/BANDAGES/DRESSINGS) ×1 IMPLANT
BIT DRILL 2 LNG CALIBR (DRILL) ×2 IMPLANT
BIT DRILL 2.5 CANN LNG (BIT) ×2 IMPLANT
BLADE SURG 10 STRL SS (BLADE) ×3 IMPLANT
BNDG CMPR 9X6 STRL LF SNTH (GAUZE/BANDAGES/DRESSINGS) ×1
BNDG COHESIVE 4X5 TAN STRL (GAUZE/BANDAGES/DRESSINGS) ×1 IMPLANT
BNDG ESMARK 6X9 LF (GAUZE/BANDAGES/DRESSINGS) ×3
BNDG GAUZE ELAST 4 BULKY (GAUZE/BANDAGES/DRESSINGS) ×4 IMPLANT
BRUSH SCRUB DISP (MISCELLANEOUS) ×6 IMPLANT
COVER MAYO STAND STRL (DRAPES) ×3 IMPLANT
COVER SURGICAL LIGHT HANDLE (MISCELLANEOUS) ×6 IMPLANT
CUFF TOURNIQUET SINGLE 34IN LL (TOURNIQUET CUFF) ×2 IMPLANT
DRAPE C-ARM 42X72 X-RAY (DRAPES) ×2 IMPLANT
DRAPE C-ARMOR (DRAPES) ×4 IMPLANT
DRAPE EXTREMITY T 121X128X90 (DRAPE) ×3 IMPLANT
DRAPE OEC MINIVIEW 54X84 (DRAPES) ×3 IMPLANT
DRAPE ORTHO SPLIT 77X108 STRL (DRAPES) ×3
DRAPE PROXIMA HALF (DRAPES) ×5 IMPLANT
DRAPE SURG ORHT 6 SPLT 77X108 (DRAPES) ×1 IMPLANT
DRAPE U-SHAPE 47X51 STRL (DRAPES) ×3 IMPLANT
DRSG ADAPTIC 3X8 NADH LF (GAUZE/BANDAGES/DRESSINGS) ×3 IMPLANT
DRSG EMULSION OIL 3X3 NADH (GAUZE/BANDAGES/DRESSINGS) IMPLANT
DRSG PAD ABDOMINAL 8X10 ST (GAUZE/BANDAGES/DRESSINGS) ×4 IMPLANT
ELECT REM PT RETURN 9FT ADLT (ELECTROSURGICAL) ×3
ELECTRODE REM PT RTRN 9FT ADLT (ELECTROSURGICAL) ×1 IMPLANT
GAUZE SPONGE 4X4 12PLY STRL (GAUZE/BANDAGES/DRESSINGS) ×3 IMPLANT
GAUZE SPONGE 4X4 16PLY XRAY LF (GAUZE/BANDAGES/DRESSINGS) ×2 IMPLANT
GLOVE BIO SURGEON STRL SZ7.5 (GLOVE) ×1 IMPLANT
GLOVE BIO SURGEON STRL SZ8 (GLOVE) ×3 IMPLANT
GLOVE BIOGEL PI IND STRL 7.5 (GLOVE) ×1 IMPLANT
GLOVE BIOGEL PI IND STRL 8 (GLOVE) ×1 IMPLANT
GLOVE BIOGEL PI INDICATOR 7.5 (GLOVE)
GLOVE BIOGEL PI INDICATOR 8 (GLOVE) ×2
GOWN STRL REUS W/ TWL LRG LVL3 (GOWN DISPOSABLE) ×2 IMPLANT
GOWN STRL REUS W/ TWL XL LVL3 (GOWN DISPOSABLE) ×1 IMPLANT
GOWN STRL REUS W/TWL LRG LVL3 (GOWN DISPOSABLE) ×6
GOWN STRL REUS W/TWL XL LVL3 (GOWN DISPOSABLE) ×3
K-WIRE BB-TAK (WIRE) ×3
KIT BASIN OR (CUSTOM PROCEDURE TRAY) ×3 IMPLANT
KIT ROOM TURNOVER OR (KITS) ×3 IMPLANT
KIT STIMULAN RAPID CURE 5CC (Orthopedic Implant) ×2 IMPLANT
KWIRE BB-TAK (WIRE) IMPLANT
MANIFOLD NEPTUNE II (INSTRUMENTS) ×1 IMPLANT
NDL HYPO 21X1.5 SAFETY (NEEDLE) IMPLANT
NEEDLE 22X1 1/2 (OR ONLY) (NEEDLE) ×2 IMPLANT
NEEDLE HYPO 21X1.5 SAFETY (NEEDLE) IMPLANT
NS IRRIG 1000ML POUR BTL (IV SOLUTION) ×7 IMPLANT
PACK GENERAL/GYN (CUSTOM PROCEDURE TRAY) ×3 IMPLANT
PACK ORTHO EXTREMITY (CUSTOM PROCEDURE TRAY) ×3 IMPLANT
PAD ARMBOARD 7.5X6 YLW CONV (MISCELLANEOUS) ×6 IMPLANT
PAD CAST 4YDX4 CTTN HI CHSV (CAST SUPPLIES) IMPLANT
PADDING CAST COTTON 4X4 STRL (CAST SUPPLIES) ×3
PADDING CAST COTTON 6X4 STRL (CAST SUPPLIES) ×5 IMPLANT
PLATE 5HOLE LOCKING 91MML (Plate) ×1 IMPLANT
PLATE LOCKING MEDIAL HOOK 5H (Plate) ×2 IMPLANT
PLATE LOCKING STR 6H (Plate) ×2 IMPLANT
SCREW CANCELLOUS 4.0 (Screw) ×1 IMPLANT
SCREW CORT 3.5X40 LP ANKLE (Screw) ×2 IMPLANT
SCREW CORT T15 FT 32X3.5XST (Screw) IMPLANT
SCREW CORT T15 FT 50X3.5XST (Screw) IMPLANT
SCREW CORTICAL 3.5 (Screw) ×8 IMPLANT
SCREW LOCKING 2.7X14MM (Screw) ×2 IMPLANT
SCREW LOCKING 2.7X16MM (Screw) ×6 IMPLANT
SCREW LOW PROFILE 3.5X14 (Screw) ×2 IMPLANT
SCREW LOW PROFILE 3.5X16 (Screw) ×4 IMPLANT
SCREW NLOCK T15 FT 18X3.5XST (Screw) IMPLANT
SCREW NON LOCK 3.5X18MM (Screw) ×3 IMPLANT
SCREW NON LOCK 3.5X36 ANKLE (Screw) ×4 IMPLANT
SCREW NON-LOCKING 3.5X22MM (Screw) ×2 IMPLANT
SPONGE LAP 18X18 X RAY DECT (DISPOSABLE) ×9 IMPLANT
SPONGE SCRUB IODOPHOR (GAUZE/BANDAGES/DRESSINGS) ×1 IMPLANT
STAPLER VISISTAT 35W (STAPLE) ×2 IMPLANT
SUCTION FRAZIER HANDLE 10FR (MISCELLANEOUS) ×2
SUCTION TUBE FRAZIER 10FR DISP (MISCELLANEOUS) ×1 IMPLANT
SUT ETHILON 2 0 FS 18 (SUTURE) ×3 IMPLANT
SUT ETHILON 3 0 PS 1 (SUTURE) ×6 IMPLANT
SUT PDS AB 2-0 CT1 27 (SUTURE) ×2 IMPLANT
SUT VIC AB 0 CT1 27 (SUTURE) ×3
SUT VIC AB 0 CT1 27XBRD ANBCTR (SUTURE) IMPLANT
SUT VIC AB 2-0 CT1 27 (SUTURE) ×6
SUT VIC AB 2-0 CT1 TAPERPNT 27 (SUTURE) ×2 IMPLANT
SUT VIC AB 2-0 CT3 27 (SUTURE) IMPLANT
SYR CONTROL 10ML LL (SYRINGE) ×2 IMPLANT
TOWEL OR 17X24 6PK STRL BLUE (TOWEL DISPOSABLE) ×2 IMPLANT
TOWEL OR 17X26 10 PK STRL BLUE (TOWEL DISPOSABLE) ×2 IMPLANT
TUBE CONNECTING 12'X1/4 (SUCTIONS) ×1
TUBE CONNECTING 12X1/4 (SUCTIONS) ×2 IMPLANT
UNDERPAD 30X30 (UNDERPADS AND DIAPERS) ×3 IMPLANT
WATER STERILE IRR 1000ML POUR (IV SOLUTION) ×2 IMPLANT
YANKAUER SUCT BULB TIP NO VENT (SUCTIONS) ×3 IMPLANT

## 2016-07-20 NOTE — Anesthesia Procedure Notes (Signed)
Anesthesia Regional Block:  Popliteal block  Pre-Anesthetic Checklist: ,, timeout performed, Correct Patient, Correct Site, Correct Laterality, Correct Procedure, Correct Position, site marked, Risks and benefits discussed,  Surgical consent,  Pre-op evaluation,  At surgeon's request and post-op pain management  Laterality: Right  Prep: chloraprep       Needles:  Injection technique: Single-shot  Needle Type: Echogenic Needle     Needle Length: 9cm 9 cm Needle Gauge: 21 G    Additional Needles:  Procedures: ultrasound guided (picture in chart) Popliteal block Narrative:  Start time: 07/20/2016 8:30 AM End time: 07/20/2016 8:40 AM Injection made incrementally with aspirations every 5 mL.  Performed by: Personally  Anesthesiologist: Ahjanae Cassel  Additional Notes: Patient tolerated the procedure well without complications

## 2016-07-20 NOTE — Anesthesia Preprocedure Evaluation (Signed)
Anesthesia Evaluation  Patient identified by MRN, date of birth, ID band Patient awake    Reviewed: Allergy & Precautions, NPO status , Patient's Chart, lab work & pertinent test results  Airway Mallampati: II  TM Distance: >3 FB Neck ROM: Full    Dental no notable dental hx.    Pulmonary Current Smoker,    Pulmonary exam normal breath sounds clear to auscultation       Cardiovascular negative cardio ROS Normal cardiovascular exam Rhythm:Regular Rate:Normal     Neuro/Psych negative neurological ROS  negative psych ROS   GI/Hepatic negative GI ROS, Neg liver ROS,   Endo/Other  negative endocrine ROS  Renal/GU negative Renal ROS  negative genitourinary   Musculoskeletal negative musculoskeletal ROS (+)   Abdominal   Peds negative pediatric ROS (+)  Hematology negative hematology ROS (+)   Anesthesia Other Findings   Reproductive/Obstetrics negative OB ROS                             Anesthesia Physical Anesthesia Plan  ASA: II  Anesthesia Plan: General   Post-op Pain Management: GA combined w/ Regional for post-op pain   Induction: Intravenous  Airway Management Planned: Oral ETT  Additional Equipment:   Intra-op Plan:   Post-operative Plan: Extubation in OR  Informed Consent: I have reviewed the patients History and Physical, chart, labs and discussed the procedure including the risks, benefits and alternatives for the proposed anesthesia with the patient or authorized representative who has indicated his/her understanding and acceptance.   Dental advisory given  Plan Discussed with: CRNA and Surgeon  Anesthesia Plan Comments:         Anesthesia Quick Evaluation

## 2016-07-20 NOTE — Discharge Instructions (Signed)
[facility] Discharge Instructions   General Discharge Instructions  WEIGHT BEARING STATUS: Nonweightbearing Right Leg   RANGE OF MOTION/ACTIVITY: do not remove splint on Right leg. Keep splint clean and dry. We will remove splint at first follow up    PAIN MEDICATION USE AND EXPECTATIONS  You have likely been given narcotic medications to help control your pain.  After a traumatic event that results in an fracture (broken bone) with or without surgery, it is ok to use narcotic pain medications to help control one's pain.  We understand that everyone responds to pain differently and each individual patient will be evaluated on a regular basis for the continued need for narcotic medications. Ideally, narcotic medication use should last no more than 6-8 weeks (coinciding with fracture healing).   As a patient it is your responsibility as well to monitor narcotic medication use and report the amount and frequency you use these medications when you come to your office visit.   We would also advise that if you are using narcotic medications, you should take a dose prior to therapy to maximize you participation.  IF YOU ARE ON NARCOTIC MEDICATIONS IT IS NOT PERMISSIBLE TO OPERATE A MOTOR VEHICLE (MOTORCYCLE/CAR/TRUCK/MOPED) OR HEAVY MACHINERY DO NOT MIX NARCOTICS WITH OTHER CNS (CENTRAL NERVOUS SYSTEM) DEPRESSANTS SUCH AS ALCOHOL  Diet: as you were eating previously.  Can use over the counter stool softeners and bowel preparations, such as Miralax, to help with bowel movements.  Narcotics can be constipating.  Be sure to drink plenty of fluids    STOP SMOKING OR USING NICOTINE PRODUCTS!!!!  As discussed nicotine severely impairs your body's ability to heal surgical and traumatic wounds but also impairs bone healing.  Wounds and bone heal by forming microscopic blood vessels (angiogenesis) and nicotine is a vasoconstrictor (essentially, shrinks blood vessels).  Therefore, if  vasoconstriction occurs to these microscopic blood vessels they essentially disappear and are unable to deliver necessary nutrients to the healing tissue.  This is one modifiable factor that you can do to dramatically increase your chances of healing your injury.    (This means no smoking, no nicotine gum, patches, etc)  DO NOT USE NONSTEROIDAL ANTI-INFLAMMATORY DRUGS (NSAID'S)  Using products such as Advil (ibuprofen), Aleve (naproxen), Motrin (ibuprofen) for additional pain control during fracture healing can delay and/or prevent the healing response.  If you would like to take over the counter (OTC) medication, Tylenol (acetaminophen) is ok.  However, some narcotic medications that are given for pain control contain acetaminophen as well. Therefore, you should not exceed more than 4000 mg of tylenol in a day if you do not have liver disease.  Also note that there are may OTC medicines, such as cold medicines and allergy medicines that my contain tylenol as well.  If you have any questions about medications and/or interactions please ask your doctor/PA or your pharmacist.      ICE AND ELEVATE INJURED/OPERATIVE EXTREMITY  Using ice and elevating the injured extremity above your heart can help with swelling and pain control.  Icing in a pulsatile fashion, such as 20 minutes on and 20 minutes off, can be followed.    Do not place ice directly on skin. Make sure there is a barrier between to skin and the ice pack.    Using frozen items such as frozen peas works well as the conform nicely to the are that needs to be iced.  USE AN ACE WRAP OR TED HOSE FOR SWELLING CONTROL  In  addition to icing and elevation, Ace wraps or TED hose are used to help limit and resolve swelling.  It is recommended to use Ace wraps or TED hose until you are informed to stop.    When using Ace Wraps start the wrapping distally (farthest away from the body) and wrap proximally (closer to the body)   Example: If you had surgery on  your leg or thing and you do not have a splint on, start the ace wrap at the toes and work your way up to the thigh        If you had surgery on your upper extremity and do not have a splint on, start the ace wrap at your fingers and work your way up to the upper arm  IF YOU ARE IN A SPLINT OR CAST DO NOT REMOVE IT FOR ANY REASON   If your splint gets wet for any reason please contact the office immediately. You may shower in your splint or cast as long as you keep it dry.  This can be done by wrapping in a cast cover or garbage back (or similar)  Do Not stick any thing down your splint or cast such as pencils, money, or hangers to try and scratch yourself with.  If you feel itchy take benadryl as prescribed on the bottle for itching  IF YOU ARE IN A CAM BOOT (BLACK BOOT)  You may remove boot periodically. Perform daily dressing changes as noted below.  Wash the liner of the boot regularly and wear a sock when wearing the boot. It is recommended that you sleep in the boot until told otherwise  CALL THE OFFICE WITH ANY QUESTIONS OR CONCERNS: [phone]
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
## 2016-07-20 NOTE — Transfer of Care (Signed)
Immediate Anesthesia Transfer of Care Note  Patient: Corey Moss  Procedure(s) Performed: Procedure(s): OPEN REDUCTION INTERNAL FIXATION (ORIF) ANKLE FRACTURE (Right) REMOVAL EXTERNAL FIXATION LEG (Right) PARTIAL EXCISION CALCANEUS,,ANTIBODIES BEADS (Right)  Patient Location: PACU  Anesthesia Type:General, Regional and GA combined with regional for post-op pain  Level of Consciousness: awake, oriented, sedated, patient cooperative and responds to stimulation  Airway & Oxygen Therapy: Patient Spontanous Breathing  Post-op Assessment: Report given to RN, Post -op Vital signs reviewed and stable, Patient moving all extremities and Patient moving all extremities X 4  Post vital signs: Reviewed and stable  Last Vitals:  Vitals:   07/20/16 0817  BP: (!) 152/82  Pulse: 74  Resp: 18  Temp: 37.5 C    Last Pain:  Vitals:   07/20/16 0817  TempSrc: Oral      Patients Stated Pain Goal: 3 (07/20/16 16100822)  Complications: No apparent anesthesia complications

## 2016-07-20 NOTE — Anesthesia Procedure Notes (Addendum)
Procedure Name: Intubation Date/Time: 07/20/2016 9:49 AM Performed by: Wray KearnsFOLEY, Lya Holben A Pre-anesthesia Checklist: Patient identified, Emergency Drugs available, Suction available and Patient being monitored Patient Re-evaluated:Patient Re-evaluated prior to inductionOxygen Delivery Method: Circle System Utilized and Circle system utilized Preoxygenation: Pre-oxygenation with 100% oxygen Intubation Type: IV induction and Cricoid Pressure applied Ventilation: Mask ventilation without difficulty Laryngoscope Size: Miller and 2 Grade View: Grade I Tube type: Oral Tube size: 7.5 mm Number of attempts: 1 Airway Equipment and Method: Stylet and Oral airway Placement Confirmation: ETT inserted through vocal cords under direct vision,  positive ETCO2 and breath sounds checked- equal and bilateral Secured at: 23 cm Tube secured with: Tape Dental Injury: Teeth and Oropharynx as per pre-operative assessment
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
## 2016-07-24 NOTE — Anesthesia Postprocedure Evaluation (Addendum)
Anesthesia Post Note  Patient: Corey Moss  Procedure(s) Performed: Procedure(s) (LRB): OPEN REDUCTION INTERNAL FIXATION (ORIF) ANKLE FRACTURE (Right) REMOVAL EXTERNAL FIXATION LEG (Right) PARTIAL EXCISION CALCANEUS,,ANTIBODIES BEADS (Right)  Patient location during evaluation: PACU Anesthesia Type: General and Regional Level of consciousness: awake and alert Pain management: pain level controlled Vital Signs Assessment: post-procedure vital signs reviewed and stable Respiratory status: spontaneous breathing, nonlabored ventilation, respiratory function stable and patient connected to nasal cannula oxygen Cardiovascular status: blood pressure returned to baseline and stable Postop Assessment: no signs of nausea or vomiting Anesthetic complications: no        Last Vitals:  Vitals:   07/20/16 1403 07/20/16 1433  BP: 139/88 (!) 157/80  Pulse: 86 77  Resp: 16 17  Temp:  36.1 C    Last Pain:  Vitals:   07/20/16 0817  TempSrc: Oral   Pain Goal: Patients Stated Pain Goal: 3 (07/20/16 0822)               Shelton SilvasKevin D Brazil Voytko

## 2016-07-25 ENCOUNTER — Encounter (HOSPITAL_COMMUNITY): Payer: Self-pay | Admitting: Orthopedic Surgery

## 2016-07-25 LAB — AEROBIC/ANAEROBIC CULTURE (SURGICAL/DEEP WOUND)

## 2016-07-25 LAB — AEROBIC/ANAEROBIC CULTURE W GRAM STAIN (SURGICAL/DEEP WOUND)

## 2016-10-10 MED FILL — HYDROCODON-APAP 5-325: 5-325 | 15 days supply | Qty: 60 | Fill #0

## 2016-11-26 ENCOUNTER — Encounter (HOSPITAL_COMMUNITY): Payer: Self-pay | Admitting: *Deleted

## 2016-11-26 DIAGNOSIS — F17200 Nicotine dependence, unspecified, uncomplicated: Secondary | ICD-10-CM | POA: Diagnosis present
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
## 2016-11-27 ENCOUNTER — Inpatient Hospital Stay (HOSPITAL_COMMUNITY): Payer: No Typology Code available for payment source

## 2016-11-27 ENCOUNTER — Inpatient Hospital Stay (HOSPITAL_COMMUNITY): Payer: No Typology Code available for payment source | Admitting: Anesthesiology

## 2016-11-27 ENCOUNTER — Encounter (HOSPITAL_COMMUNITY)
Admission: RE | Disposition: A | Discharge status: Home Health | Payer: Self-pay | Source: Ambulatory Visit | Attending: Orthopedic Surgery

## 2016-11-27 ENCOUNTER — Inpatient Hospital Stay (HOSPITAL_COMMUNITY)
Admission: RE | Admit: 2016-11-27 | Discharge: 2016-11-29 | DRG: 493 | Disposition: A | Discharge status: Home Health | Payer: No Typology Code available for payment source | Source: Ambulatory Visit | Attending: Orthopedic Surgery | Admitting: Orthopedic Surgery

## 2016-11-27 ENCOUNTER — Encounter (HOSPITAL_COMMUNITY): Payer: Self-pay | Admitting: *Deleted

## 2016-11-27 DIAGNOSIS — M12571 Traumatic arthropathy, right ankle and foot: Secondary | ICD-10-CM | POA: Diagnosis present

## 2016-11-27 DIAGNOSIS — S82841P Displaced bimalleolar fracture of right lower leg, subsequent encounter for closed fracture with malunion: Secondary | ICD-10-CM

## 2016-11-27 DIAGNOSIS — Z7982 Long term (current) use of aspirin: Secondary | ICD-10-CM

## 2016-11-27 DIAGNOSIS — D62 Acute posthemorrhagic anemia: Secondary | ICD-10-CM | POA: Diagnosis not present

## 2016-11-27 DIAGNOSIS — Z01811 Encounter for preprocedural respiratory examination: Secondary | ICD-10-CM

## 2016-11-27 DIAGNOSIS — Z87820 Personal history of traumatic brain injury: Secondary | ICD-10-CM | POA: Diagnosis not present

## 2016-11-27 DIAGNOSIS — Z9119 Patient's noncompliance with other medical treatment and regimen: Secondary | ICD-10-CM

## 2016-11-27 DIAGNOSIS — S82391Q Other fracture of lower end of right tibia, subsequent encounter for open fracture type I or II with malunion: Secondary | ICD-10-CM | POA: Diagnosis present

## 2016-11-27 DIAGNOSIS — M19171 Post-traumatic osteoarthritis, right ankle and foot: Secondary | ICD-10-CM | POA: Diagnosis present

## 2016-11-27 DIAGNOSIS — F17200 Nicotine dependence, unspecified, uncomplicated: Secondary | ICD-10-CM | POA: Diagnosis present

## 2016-11-27 DIAGNOSIS — Z8249 Family history of ischemic heart disease and other diseases of the circulatory system: Secondary | ICD-10-CM

## 2016-11-27 DIAGNOSIS — Z01818 Encounter for other preprocedural examination: Secondary | ICD-10-CM

## 2016-11-27 DIAGNOSIS — Z419 Encounter for procedure for purposes other than remedying health state, unspecified: Secondary | ICD-10-CM

## 2016-11-27 HISTORY — PX: HARDWARE REMOVAL: SHX979

## 2016-11-27 HISTORY — DX: Other psychoactive substance abuse, uncomplicated: F19.10

## 2016-11-27 HISTORY — DX: Nicotine dependence, unspecified, uncomplicated: F17.200

## 2016-11-27 HISTORY — DX: Post-traumatic osteoarthritis, right ankle and foot: M19.171

## 2016-11-27 HISTORY — PX: FOOT ARTHRODESIS: SHX1655

## 2016-11-27 HISTORY — DX: Respiratory tuberculosis unspecified: A15.9

## 2016-11-27 LAB — URINALYSIS, ROUTINE W REFLEX MICROSCOPIC
BILIRUBIN URINE: NEGATIVE
GLUCOSE, UA: NEGATIVE mg/dL
HGB URINE DIPSTICK: NEGATIVE
KETONES UR: NEGATIVE mg/dL
LEUKOCYTES UA: NEGATIVE
Nitrite: NEGATIVE
PROTEIN: NEGATIVE mg/dL
Specific Gravity, Urine: 1.013 (ref 1.005–1.030)
pH: 5 (ref 5.0–8.0)

## 2016-11-27 LAB — COMPREHENSIVE METABOLIC PANEL
ALBUMIN: 3.8 g/dL (ref 3.5–5.0)
ALK PHOS: 92 U/L (ref 38–126)
ALT: 15 U/L — ABNORMAL LOW (ref 17–63)
ANION GAP: 6 (ref 5–15)
AST: 17 U/L (ref 15–41)
BUN: 8 mg/dL (ref 6–20)
CO2: 26 mmol/L (ref 22–32)
Calcium: 9.3 mg/dL (ref 8.9–10.3)
Chloride: 106 mmol/L (ref 101–111)
Creatinine, Ser: 0.77 mg/dL (ref 0.61–1.24)
GFR calc non Af Amer: >60 mL/min (ref >60)
Glucose, Bld: 92 mg/dL (ref 65–99)
Potassium: 4.7 mmol/L (ref 3.5–5.1)
SODIUM: 138 mmol/L (ref 135–145)
TOTAL PROTEIN: 7.1 g/dL (ref 6.5–8.1)
Total Bilirubin: 0.3 mg/dL (ref 0.3–1.2)

## 2016-11-27 LAB — CBC WITH DIFFERENTIAL/PLATELET
BASOS ABS: 0.1 10*3/uL (ref 0.0–0.1)
BASOS PCT: 1 %
EOS PCT: 5 %
Eosinophils Absolute: 0.5 10*3/uL (ref 0.0–0.7)
HCT: 45.8 % (ref 39.0–52.0)
Hemoglobin: 14.9 g/dL (ref 13.0–17.0)
Lymphocytes Relative: 33 %
Lymphs Abs: 3.6 10*3/uL (ref 0.7–4.0)
MCH: 26.5 pg (ref 26.0–34.0)
MCHC: 32.5 g/dL (ref 30.0–36.0)
MCV: 81.3 fL (ref 78.0–100.0)
MONO ABS: 0.8 10*3/uL (ref 0.1–1.0)
Monocytes Relative: 8 %
NEUTROS ABS: 6 10*3/uL (ref 1.7–7.7)
Neutrophils Relative %: 55 %
PLATELETS: 245 10*3/uL (ref 150–400)
RBC: 5.63 MIL/uL (ref 4.22–5.81)
RDW: 18 % — AB (ref 11.5–15.5)
WBC: 10.9 10*3/uL — ABNORMAL HIGH (ref 4.0–10.5)

## 2016-11-27 LAB — PROTIME-INR
INR: 0.98
Prothrombin Time: 13 seconds (ref 11.4–15.2)

## 2016-11-27 LAB — ABO/RH: ABO/RH(D): O NEG

## 2016-11-27 LAB — RAPID URINE DRUG SCREEN, HOSP PERFORMED
Amphetamines: NOT DETECTED
BARBITURATES: NOT DETECTED
Benzodiazepines: NOT DETECTED
COCAINE: NOT DETECTED
Opiates: NOT DETECTED
TETRAHYDROCANNABINOL: NOT DETECTED

## 2016-11-27 LAB — TYPE AND SCREEN
ABO/RH(D): O NEG
Antibody Screen: NEGATIVE

## 2016-11-27 LAB — APTT: APTT: 31 s (ref 24–36)

## 2016-11-27 SURGERY — FUSION, JOINT, FOOT
Anesthesia: General | Site: Foot | Laterality: Right

## 2016-11-27 MED ORDER — MAGNESIUM CITRATE PO SOLN: 1.0000 | Freq: Once | ORAL | Status: DC | PRN

## 2016-11-27 MED ORDER — ONDANSETRON HCL 4 MG PO TABS: 4.0000 mg | ORAL_TABLET | Freq: Four times a day (QID) | ORAL | Status: DC | PRN

## 2016-11-27 MED ORDER — LACTATED RINGERS IV SOLN
INTRAVENOUS | Status: DC
  Administered 2016-11-27 (×3): via INTRAVENOUS

## 2016-11-27 MED ORDER — CHLORHEXIDINE GLUCONATE 4 % EX LIQD: 60.0000 mL | Freq: Once | CUTANEOUS | Status: DC

## 2016-11-27 MED ORDER — MIDAZOLAM HCL 2 MG/2ML IJ SOLN
INTRAMUSCULAR | Status: AC
  Filled 2016-11-27: qty 2

## 2016-11-27 MED ORDER — DIPHENHYDRAMINE HCL 50 MG/ML IJ SOLN: 12.5000 mg | Freq: Four times a day (QID) | INTRAMUSCULAR | Status: DC | PRN

## 2016-11-27 MED ORDER — ONDANSETRON HCL 4 MG/2ML IJ SOLN
INTRAMUSCULAR | Status: AC
  Filled 2016-11-27: qty 2

## 2016-11-27 MED ORDER — MORPHINE SULFATE 2 MG/ML IV SOLN
INTRAVENOUS | Status: DC
  Administered 2016-11-27: 24 mg via INTRAVENOUS
  Administered 2016-11-27 – 2016-11-28 (×2): via INTRAVENOUS
  Administered 2016-11-28: 30 mg via INTRAVENOUS
  Administered 2016-11-28: 25.5 mg via INTRAVENOUS
  Filled 2016-11-27 (×3): qty 30

## 2016-11-27 MED ORDER — HYDROMORPHONE HCL 1 MG/ML IJ SOLN
0.2500 mg | INTRAMUSCULAR | Status: DC | PRN
  Administered 2016-11-27 (×3): 0.5 mg via INTRAVENOUS

## 2016-11-27 MED ORDER — MIDAZOLAM HCL 5 MG/5ML IJ SOLN
INTRAMUSCULAR | Status: DC | PRN
  Administered 2016-11-27: 2 mg via INTRAVENOUS

## 2016-11-27 MED ORDER — DIPHENHYDRAMINE HCL 12.5 MG/5ML PO ELIX: 12.5000 mg | ORAL_SOLUTION | Freq: Four times a day (QID) | ORAL | Status: DC | PRN

## 2016-11-27 MED ORDER — LACTATED RINGERS IV SOLN
INTRAVENOUS | Status: DC | PRN
  Administered 2016-11-27: 11:00:00 via INTRAVENOUS

## 2016-11-27 MED ORDER — HYDROMORPHONE HCL 1 MG/ML IJ SOLN
INTRAMUSCULAR | Status: AC
  Administered 2016-11-27: 1 mg
  Filled 2016-11-27: qty 1

## 2016-11-27 MED ORDER — CEFAZOLIN SODIUM-DEXTROSE 1-4 GM/50ML-% IV SOLN
1.0000 g | Freq: Four times a day (QID) | INTRAVENOUS | Status: AC
  Administered 2016-11-27 – 2016-11-28 (×3): 1 g via INTRAVENOUS
  Filled 2016-11-27 (×3): qty 50

## 2016-11-27 MED ORDER — ACETAMINOPHEN 650 MG RE SUPP: 650.0000 mg | Freq: Four times a day (QID) | RECTAL | Status: DC | PRN

## 2016-11-27 MED ORDER — SUGAMMADEX SODIUM 200 MG/2ML IV SOLN
INTRAVENOUS | Status: DC | PRN
Start: 2016-11-27 — End: 2016-11-27
  Administered 2016-11-27: 210 mg via INTRAVENOUS

## 2016-11-27 MED ORDER — NALOXONE HCL 0.4 MG/ML IJ SOLN: 0.4000 mg | INTRAMUSCULAR | Status: DC | PRN

## 2016-11-27 MED ORDER — ROCURONIUM BROMIDE 10 MG/ML (PF) SYRINGE
PREFILLED_SYRINGE | INTRAVENOUS | Status: AC
  Filled 2016-11-27: qty 5

## 2016-11-27 MED ORDER — EPHEDRINE 5 MG/ML INJ
INTRAVENOUS | Status: AC
  Filled 2016-11-27: qty 10

## 2016-11-27 MED ORDER — FENTANYL CITRATE (PF) 250 MCG/5ML IJ SOLN
INTRAMUSCULAR | Status: AC
  Filled 2016-11-27: qty 5

## 2016-11-27 MED ORDER — POTASSIUM CHLORIDE IN NACL 20-0.9 MEQ/L-% IV SOLN
INTRAVENOUS | Status: DC
  Administered 2016-11-28: 05:00:00 via INTRAVENOUS
  Filled 2016-11-27: qty 1000

## 2016-11-27 MED ORDER — SUGAMMADEX SODIUM 200 MG/2ML IV SOLN
INTRAVENOUS | Status: AC
  Filled 2016-11-27: qty 2

## 2016-11-27 MED ORDER — DOCUSATE SODIUM 100 MG PO CAPS
100.0000 mg | ORAL_CAPSULE | Freq: Two times a day (BID) | ORAL | Status: DC
  Administered 2016-11-27 – 2016-11-29 (×4): 100 mg via ORAL
  Filled 2016-11-27 (×3): qty 1

## 2016-11-27 MED ORDER — PNEUMOCOCCAL VAC POLYVALENT 25 MCG/0.5ML IJ INJ
0.5000 mL | INJECTION | INTRAMUSCULAR | Status: DC
  Filled 2016-11-27: qty 0.5

## 2016-11-27 MED ORDER — FENTANYL CITRATE (PF) 100 MCG/2ML IJ SOLN
INTRAMUSCULAR | Status: DC | PRN
  Administered 2016-11-27 (×6): 50 ug via INTRAVENOUS
  Administered 2016-11-27: 100 ug via INTRAVENOUS
  Administered 2016-11-27 (×2): 50 ug via INTRAVENOUS

## 2016-11-27 MED ORDER — ARTIFICIAL TEARS OPHTHALMIC OINT
TOPICAL_OINTMENT | OPHTHALMIC | Status: DC | PRN
  Administered 2016-11-27: 1 via OPHTHALMIC

## 2016-11-27 MED ORDER — ROCURONIUM BROMIDE 100 MG/10ML IV SOLN
INTRAVENOUS | Status: DC | PRN
  Administered 2016-11-27: 10 mg via INTRAVENOUS
  Administered 2016-11-27: 40 mg via INTRAVENOUS
  Administered 2016-11-27: 30 mg via INTRAVENOUS
  Administered 2016-11-27: 20 mg via INTRAVENOUS

## 2016-11-27 MED ORDER — 0.9 % SODIUM CHLORIDE (POUR BTL) OPTIME
TOPICAL | Status: DC | PRN
  Administered 2016-11-27: 1000 mL

## 2016-11-27 MED ORDER — FENTANYL CITRATE (PF) 100 MCG/2ML IJ SOLN
INTRAMUSCULAR | Status: AC
  Filled 2016-11-27: qty 2

## 2016-11-27 MED ORDER — PROPOFOL 10 MG/ML IV BOLUS
INTRAVENOUS | Status: DC | PRN
  Administered 2016-11-27: 40 mg via INTRAVENOUS
  Administered 2016-11-27: 50 mg via INTRAVENOUS
  Administered 2016-11-27: 200 mg via INTRAVENOUS

## 2016-11-27 MED ORDER — KETOROLAC TROMETHAMINE 30 MG/ML IJ SOLN: 30.0000 mg | Freq: Once | INTRAMUSCULAR | Status: DC | PRN

## 2016-11-27 MED ORDER — METOCLOPRAMIDE HCL 5 MG/ML IJ SOLN: 5.0000 mg | Freq: Three times a day (TID) | INTRAMUSCULAR | Status: DC | PRN

## 2016-11-27 MED ORDER — HYDROMORPHONE HCL 1 MG/ML IJ SOLN
INTRAMUSCULAR | Status: AC
  Filled 2016-11-27: qty 0.5

## 2016-11-27 MED ORDER — POLYETHYLENE GLYCOL 3350 17 G PO PACK
17.0000 g | PACK | Freq: Every day | ORAL | Status: DC
  Administered 2016-11-28 – 2016-11-29 (×2): 17 g via ORAL
  Filled 2016-11-27: qty 1

## 2016-11-27 MED ORDER — ONDANSETRON HCL 4 MG/2ML IJ SOLN: 4.0000 mg | Freq: Four times a day (QID) | INTRAMUSCULAR | Status: DC | PRN

## 2016-11-27 MED ORDER — HYDROCODONE-ACETAMINOPHEN 7.5-325 MG PO TABS
ORAL_TABLET | ORAL | Status: AC
  Filled 2016-11-27: qty 1

## 2016-11-27 MED ORDER — ARTIFICIAL TEARS OPHTHALMIC OINT
TOPICAL_OINTMENT | OPHTHALMIC | Status: AC
  Filled 2016-11-27: qty 3.5

## 2016-11-27 MED ORDER — LIDOCAINE HCL (CARDIAC) 20 MG/ML IV SOLN
INTRAVENOUS | Status: DC | PRN
  Administered 2016-11-27: 100 mg via INTRAVENOUS

## 2016-11-27 MED ORDER — ONDANSETRON HCL 4 MG/2ML IJ SOLN
INTRAMUSCULAR | Status: DC | PRN
  Administered 2016-11-27: 4 mg via INTRAVENOUS

## 2016-11-27 MED ORDER — HYDROMORPHONE HCL 1 MG/ML IJ SOLN
INTRAMUSCULAR | Status: AC
  Filled 2016-11-27: qty 1

## 2016-11-27 MED ORDER — METHOCARBAMOL 500 MG PO TABS
1000.0000 mg | ORAL_TABLET | Freq: Four times a day (QID) | ORAL | Status: DC
  Administered 2016-11-27 – 2016-11-29 (×6): 1000 mg via ORAL
  Filled 2016-11-27 (×7): qty 2

## 2016-11-27 MED ORDER — LIDOCAINE 2% (20 MG/ML) 5 ML SYRINGE
INTRAMUSCULAR | Status: AC
  Filled 2016-11-27: qty 5

## 2016-11-27 MED ORDER — PHENYLEPHRINE HCL 10 MG/ML IJ SOLN
INTRAMUSCULAR | Status: DC | PRN
  Administered 2016-11-27: 40 ug via INTRAVENOUS
  Administered 2016-11-27 (×4): 80 ug via INTRAVENOUS
  Administered 2016-11-27: 40 ug via INTRAVENOUS

## 2016-11-27 MED ORDER — ACETAMINOPHEN 325 MG PO TABS
650.0000 mg | ORAL_TABLET | Freq: Four times a day (QID) | ORAL | Status: DC | PRN
  Administered 2016-11-28: 650 mg via ORAL
  Filled 2016-11-27: qty 2

## 2016-11-27 MED ORDER — MEPERIDINE HCL 25 MG/ML IJ SOLN: 6.2500 mg | INTRAMUSCULAR | Status: DC | PRN

## 2016-11-27 MED ORDER — CEFAZOLIN SODIUM-DEXTROSE 2-4 GM/100ML-% IV SOLN
2.0000 g | INTRAVENOUS | Status: AC
  Administered 2016-11-27: 2 g via INTRAVENOUS
  Filled 2016-11-27: qty 100

## 2016-11-27 MED ORDER — EPHEDRINE SULFATE 50 MG/ML IJ SOLN
INTRAMUSCULAR | Status: DC | PRN
  Administered 2016-11-27: 5 mg via INTRAVENOUS
  Administered 2016-11-27 (×4): 10 mg via INTRAVENOUS
  Administered 2016-11-27: 5 mg via INTRAVENOUS

## 2016-11-27 MED ORDER — PHENYLEPHRINE 40 MCG/ML (10ML) SYRINGE FOR IV PUSH (FOR BLOOD PRESSURE SUPPORT)
PREFILLED_SYRINGE | INTRAVENOUS | Status: AC
  Filled 2016-11-27: qty 10

## 2016-11-27 MED ORDER — DIPHENHYDRAMINE HCL 12.5 MG/5ML PO ELIX: 12.5000 mg | ORAL_SOLUTION | ORAL | Status: DC | PRN

## 2016-11-27 MED ORDER — BISACODYL 5 MG PO TBEC: 5.0000 mg | DELAYED_RELEASE_TABLET | Freq: Every day | ORAL | Status: DC | PRN

## 2016-11-27 MED ORDER — SODIUM CHLORIDE 0.9% FLUSH: 9.0000 mL | INTRAVENOUS | Status: DC | PRN

## 2016-11-27 MED ORDER — PROMETHAZINE HCL 25 MG/ML IJ SOLN: 6.2500 mg | INTRAMUSCULAR | Status: DC | PRN

## 2016-11-27 MED ORDER — METHOCARBAMOL 1000 MG/10ML IJ SOLN
1000.0000 mg | Freq: Four times a day (QID) | INTRAVENOUS | Status: DC
  Administered 2016-11-28: 1000 mg via INTRAVENOUS
  Filled 2016-11-27 (×10): qty 10

## 2016-11-27 MED ORDER — FENTANYL CITRATE (PF) 100 MCG/2ML IJ SOLN: INTRAMUSCULAR | Status: DC | PRN

## 2016-11-27 MED ORDER — PROPOFOL 10 MG/ML IV BOLUS
INTRAVENOUS | Status: AC
  Filled 2016-11-27: qty 40

## 2016-11-27 MED ORDER — METOCLOPRAMIDE HCL 5 MG PO TABS: 5.0000 mg | ORAL_TABLET | Freq: Three times a day (TID) | ORAL | Status: DC | PRN

## 2016-11-27 MED ORDER — HYDROCODONE-ACETAMINOPHEN 7.5-325 MG PO TABS
1.0000 | ORAL_TABLET | ORAL | Status: DC | PRN
  Administered 2016-11-27: 1 via ORAL
  Administered 2016-11-28 – 2016-11-29 (×6): 2 via ORAL
  Filled 2016-11-27 (×6): qty 2

## 2016-11-27 MED ORDER — ENOXAPARIN SODIUM 40 MG/0.4ML ~~LOC~~ SOLN
40.0000 mg | SUBCUTANEOUS | Status: DC
  Administered 2016-11-28 – 2016-11-29 (×2): 40 mg via SUBCUTANEOUS
  Filled 2016-11-27 (×2): qty 0.4

## 2016-11-27 MED ORDER — ALBUMIN HUMAN 5 % IV SOLN
INTRAVENOUS | Status: DC | PRN
  Administered 2016-11-27: 14:00:00 via INTRAVENOUS

## 2016-11-27 SURGICAL SUPPLY — 75 items
BANDAGE ACE 4X5 VEL STRL LF (GAUZE/BANDAGES/DRESSINGS) ×3 IMPLANT
BANDAGE ACE 6X5 VEL STRL LF (GAUZE/BANDAGES/DRESSINGS) ×3 IMPLANT
BANDAGE ESMARK 6X9 LF (GAUZE/BANDAGES/DRESSINGS) ×1 IMPLANT
BIT DRILL CALIBRATED 4.3X320MM (BIT) IMPLANT
BIT DRILL CANN 7X200 (BIT) ×2 IMPLANT
BIT DRILL CROWE POINT TWST 4.3 (DRILL) IMPLANT
BLADE LONG MED 31MMX9MM (MISCELLANEOUS) ×1
BLADE LONG MED 31X9 (MISCELLANEOUS) ×1 IMPLANT
BNDG CMPR 9X6 STRL LF SNTH (GAUZE/BANDAGES/DRESSINGS) ×1
BNDG COHESIVE 6X5 TAN STRL LF (GAUZE/BANDAGES/DRESSINGS) ×3 IMPLANT
BNDG ESMARK 6X9 LF (GAUZE/BANDAGES/DRESSINGS) ×3
BNDG GAUZE ELAST 4 BULKY (GAUZE/BANDAGES/DRESSINGS) ×4 IMPLANT
BRUSH SCRUB SURG 4.25 DISP (MISCELLANEOUS) ×6 IMPLANT
CLOSURE WOUND 1/2 X4 (GAUZE/BANDAGES/DRESSINGS)
COVER SURGICAL LIGHT HANDLE (MISCELLANEOUS) ×6 IMPLANT
CUFF TOURNIQUET SINGLE 18IN (TOURNIQUET CUFF) IMPLANT
CUFF TOURNIQUET SINGLE 24IN (TOURNIQUET CUFF) IMPLANT
CUFF TOURNIQUET SINGLE 34IN LL (TOURNIQUET CUFF) IMPLANT
DRAPE C-ARM 42X72 X-RAY (DRAPES) IMPLANT
DRAPE C-ARMOR (DRAPES) ×3 IMPLANT
DRAPE U-SHAPE 47X51 STRL (DRAPES) ×3 IMPLANT
DRILL CALIBRATED 4.3X320MM (BIT) ×3
DRILL CROWE POINT TWIST 4.3 (DRILL) ×3
DRSG ADAPTIC 3X8 NADH LF (GAUZE/BANDAGES/DRESSINGS) ×3 IMPLANT
DRSG MEPITEL 3X4 ME34 (GAUZE/BANDAGES/DRESSINGS) ×2 IMPLANT
DRSG PAD ABDOMINAL 8X10 ST (GAUZE/BANDAGES/DRESSINGS) ×2 IMPLANT
ELECT REM PT RETURN 9FT ADLT (ELECTROSURGICAL) ×3
ELECTRODE REM PT RTRN 9FT ADLT (ELECTROSURGICAL) ×1 IMPLANT
GAUZE SPONGE 4X4 12PLY STRL (GAUZE/BANDAGES/DRESSINGS) ×3 IMPLANT
GAUZE SPONGE 4X4 12PLY STRL LF (GAUZE/BANDAGES/DRESSINGS) ×2 IMPLANT
GLOVE BIO SURGEON STRL SZ7.5 (GLOVE) ×3 IMPLANT
GLOVE BIO SURGEON STRL SZ8 (GLOVE) ×3 IMPLANT
GLOVE BIOGEL PI IND STRL 7.5 (GLOVE) ×1 IMPLANT
GLOVE BIOGEL PI IND STRL 8 (GLOVE) ×1 IMPLANT
GLOVE BIOGEL PI INDICATOR 7.5 (GLOVE) ×2
GLOVE BIOGEL PI INDICATOR 8 (GLOVE) ×2
GOWN STRL REUS W/ TWL LRG LVL3 (GOWN DISPOSABLE) ×2 IMPLANT
GOWN STRL REUS W/ TWL XL LVL3 (GOWN DISPOSABLE) ×1 IMPLANT
GOWN STRL REUS W/TWL LRG LVL3 (GOWN DISPOSABLE) ×6
GOWN STRL REUS W/TWL XL LVL3 (GOWN DISPOSABLE) ×3
GUIDEPIN 3.2X17.5 THRD DISP (PIN) ×2 IMPLANT
GUIDEWIRE 2.6X80 BEAD TIP (WIRE) IMPLANT
GUIDWIRE 2.6X80 BEAD TIP (WIRE) ×3
KIT BASIN OR (CUSTOM PROCEDURE TRAY) ×3 IMPLANT
KIT ROOM TURNOVER OR (KITS) ×3 IMPLANT
MANIFOLD NEPTUNE II (INSTRUMENTS) ×3 IMPLANT
NAIL LOCK ANKLE ANTE 11X180 (Nail) ×2 IMPLANT
NEEDLE 22X1 1/2 (OR ONLY) (NEEDLE) IMPLANT
NS IRRIG 1000ML POUR BTL (IV SOLUTION) ×3 IMPLANT
PACK ORTHO EXTREMITY (CUSTOM PROCEDURE TRAY) ×3 IMPLANT
PAD ARMBOARD 7.5X6 YLW CONV (MISCELLANEOUS) ×6 IMPLANT
PADDING CAST COTTON 6X4 STRL (CAST SUPPLIES) ×5 IMPLANT
SCREW CORT TI DBL LEAD 5X26 (Screw) ×4 IMPLANT
SCREW CORT TI DBL LEAD 5X38 (Screw) ×2 IMPLANT
SCREW CORT TI DBL LEAD 5X65 (Screw) ×2 IMPLANT
SPONGE LAP 18X18 X RAY DECT (DISPOSABLE) ×3 IMPLANT
STAPLER VISISTAT 35W (STAPLE) IMPLANT
STOCKINETTE IMPERVIOUS LG (DRAPES) ×3 IMPLANT
STRIP CLOSURE SKIN 1/2X4 (GAUZE/BANDAGES/DRESSINGS) IMPLANT
SUCTION FRAZIER HANDLE 10FR (MISCELLANEOUS)
SUCTION TUBE FRAZIER 10FR DISP (MISCELLANEOUS) IMPLANT
SUT ETHILON 3 0 PS 1 (SUTURE) IMPLANT
SUT PDS AB 2-0 CT1 27 (SUTURE) IMPLANT
SUT VIC AB 0 CT1 27 (SUTURE)
SUT VIC AB 0 CT1 27XBRD ANBCTR (SUTURE) IMPLANT
SUT VIC AB 2-0 CT1 27 (SUTURE)
SUT VIC AB 2-0 CT1 TAPERPNT 27 (SUTURE) IMPLANT
SYR CONTROL 10ML LL (SYRINGE) IMPLANT
TOWEL OR 17X24 6PK STRL BLUE (TOWEL DISPOSABLE) ×6 IMPLANT
TOWEL OR 17X26 10 PK STRL BLUE (TOWEL DISPOSABLE) ×6 IMPLANT
TUBE CONNECTING 12'X1/4 (SUCTIONS) ×1
TUBE CONNECTING 12X1/4 (SUCTIONS) ×2 IMPLANT
UNDERPAD 30X30 (UNDERPADS AND DIAPERS) ×3 IMPLANT
WATER STERILE IRR 1000ML POUR (IV SOLUTION) ×6 IMPLANT
YANKAUER SUCT BULB TIP NO VENT (SUCTIONS) ×3 IMPLANT

## 2016-11-27 NOTE — Transfer of Care (Signed)
  Immediate Anesthesia Transfer of Care Note  Patient: Corey Moss  Procedure(s) Performed: Procedure(s): ARTHRODESIS HIND FOOT FUSION (Right) HARDWARE REMOVAL RIGHT ANKLE (Right)  Patient Location: PACU  Anesthesia Type:General  Level of Consciousness: awake, alert , oriented and sedated  Airway & Oxygen Therapy: Patient Spontanous Breathing and Patient connected to nasal cannula oxygen  Post-op Assessment: Report given to RN, Post -op Vital signs reviewed and stable and Patient moving all extremities  Post vital signs: Reviewed and stable  Last Vitals:  Vitals:   11/27/16 0714  BP: 120/83  Pulse: 67  Resp: 20  Temp: 36.9 C    Last Pain:  Vitals:   11/27/16 0714  TempSrc: Oral      Patients Stated Pain Goal: 1 (11/27/16 0828)  Complications: No apparent anesthesia complications

## 2016-11-27 NOTE — Anesthesia Procedure Notes (Deleted)
Date/Time: 11/27/2016 10:42 AM Performed by: Fransisca KaufmannMEYER, Adelei Scobey E

## 2016-11-27 NOTE — Anesthesia Preprocedure Evaluation (Signed)
Anesthesia Evaluation  Patient identified by MRN, date of birth, ID band Patient awake    Reviewed: Allergy & Precautions, NPO status , Patient's Chart, lab work & pertinent test results  Airway Mallampati: II  TM Distance: >3 FB Neck ROM: Full    Dental no notable dental hx.    Pulmonary Current Smoker,    Pulmonary exam normal breath sounds clear to auscultation       Cardiovascular negative cardio ROS Normal cardiovascular exam Rhythm:Regular Rate:Normal     Neuro/Psych negative neurological ROS  negative psych ROS   GI/Hepatic negative GI ROS, Neg liver ROS,   Endo/Other  negative endocrine ROS  Renal/GU negative Renal ROS  negative genitourinary   Musculoskeletal negative musculoskeletal ROS (+)   Abdominal (+) + obese,   Peds negative pediatric ROS (+)  Hematology   Anesthesia Other Findings   Reproductive/Obstetrics negative OB ROS                             Anesthesia Physical  Anesthesia Plan  ASA: II  Anesthesia Plan: General   Post-op Pain Management: GA combined w/ Regional for post-op pain   Induction: Intravenous  Airway Management Planned: Oral ETT  Additional Equipment:   Intra-op Plan:   Post-operative Plan: Extubation in OR  Informed Consent: I have reviewed the patients History and Physical, chart, labs and discussed the procedure including the risks, benefits and alternatives for the proposed anesthesia with the patient or authorized representative who has indicated his/her understanding and acceptance.   Dental advisory given  Plan Discussed with: CRNA and Surgeon  Anesthesia Plan Comments:         Anesthesia Quick Evaluation

## 2016-11-27 NOTE — Anesthesia Procedure Notes (Addendum)
Procedure Name: Intubation Date/Time: 11/27/2016 10:42 AM Performed by: Fransisca KaufmannMEYER, Shylee Durrett E Pre-anesthesia Checklist: Patient identified, Emergency Drugs available, Suction available and Patient being monitored Patient Re-evaluated:Patient Re-evaluated prior to inductionOxygen Delivery Method: Circle System Utilized Preoxygenation: Pre-oxygenation with 100% oxygen Intubation Type: IV induction Ventilation: Mask ventilation without difficulty Laryngoscope Size: Miller and 2 Grade View: Grade I Tube type: Oral Tube size: 7.5 mm Number of attempts: 1 Airway Equipment and Method: Stylet and Oral airway Placement Confirmation: ETT inserted through vocal cords under direct vision,  positive ETCO2 and breath sounds checked- equal and bilateral Tube secured with: Tape Dental Injury: Teeth and Oropharynx as per pre-operative assessment

## 2016-11-27 NOTE — Op Note (Signed)
11/27/2016  2:48 PM  PATIENT:  Corey Moss  46 y.o. male  PRE-OPERATIVE DIAGNOSIS:   1. MALUNION OPEN PILON FRACTURE DISLOCATION 2. POST TRAUMATIC ARTHRITIS RIGHT ANKLE JOINT  POST-OPERATIVE DIAGNOSIS:   1. MALUNION OPEN PILON FRACTURE DISLOCATION 2. POST TRAUMATIC ARTHRITIS RIGHT ANKLE JOINT  PROCEDURE:  Procedure(s): 1. ARTHRODESIS HIND FOOT FUSION (Right) with Biomet fusion nail 11 x 180 compressed and statically locked. 2. OSTEOTOMY TIBIA AND FIBULA RIGHT 3. HARDWARE REMOVAL RIGHT ANKLE (Right)  SURGEON:  Surgeon(s) and Role:    Myrene Galas* Natilee Gauer, MD - Primary  PHYSICIAN ASSISTANT: 1. Montez MoritaKeith Paul, PA-C; 2. PA student  ANESTHESIA:   general  I/O:  Total I/O In: 2250 [I.V.:2000; IV Piggyback:250] Out: 470 [Urine:370; Blood:100]  SPECIMEN:  NONE  TOURNIQUET:   Total Tourniquet Time Documented: Thigh (Right) - 136 minutes Total: Thigh (Right) - 136 minutes  DICTATION: .Note written in EPIC  DISPOSITION:  To PACU.  CONDITION:  Stable.  BRIEF SUMMARY OF INDICATIONS FOR PROCEDURE:  The patient is s/p open pilon fracture dislocation of the tibia and fibula.  As a result of his noncompliance with weight bearing restrictions, the integrity of his fracture reduction and fixation were compromised and he went on to develop symptomatic end stage arthritis with talar subluxation. He has undergone extensive preoperative workup including consultation by a foot and ankle surgeon, serial injections, both diagnostic and therapeutic and now presents for definitive fusion.  We did discuss amputation as a possible alternative as well as ankle replacement. 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  He understood the risks to include, heart attack, stroke, nerve injury, vessel injury, DVT, PE, malunion, symptomatic hardware, failure of the arthrodesis and multiple others and did wish to proceed.  BRIEF SUMMARY OF PROCEDURE:  The patient was  taken to the operating room, where anesthesia was induced. The patient received preoperative antibiotics, then chlorhexidine scrub followed by betadine scrub and paint.   Our first procedure began with remaking the old incisions, which were different from those that I make to perform the fusion alone.  Dissection was carried down to the hardware and then stepwise exposure performed with the elevator directly over the hardware to keep as much periosteal supply to the bone as possible. All hardware was removed without complication from the lateral incision and medial incision.  Because of the malunited trimalleolar components an osteotomy was required to produce proper tibiotalar apposition and alignment.  I used an oscillating saw to osteotomize the fibula proximal to the joint line in a diagonal low profile trajectory. This bone was saved for autografting.  Medial malleolar malunion also contributed to malalignment and so a separate osteotomy was performed and the bone used to generate autograft, as well.  Both osteotomies were performed while my assistants protected the soft tissues with retractors, specifically the neurovascular bundles.   We then turned our attention to the fusion. K wires were placed in both planes to establish the proper cut trajectories. The saw blade was then used to cut the distal tibia and talar dome removing all cartilage.  The ends were rasped. A small caliber drill was used to place small holes in each of the bone ends, as well. My assistant held proper reduction through both the ankle and subtalar joints while I advanced a threaded guidewire through the calcaneus, across the talus, and up the center of the tibia. This was then drilled and sequentially reamed while my assistant maintained the reduction. An 11mm  x 180 mm nail was placed. The talus screw was placed in the dynamic hole and then the medial locking bolts in the tibia. The internal compression device was engaged. In  similar fashion, the heel plate was advanced to compress the subtalar joint and then the calcaneal screw placed. Autologous bone graft was placed medially, laterally, and posteriorly. Final images showed excellent alignment, hardware position and length.   Wounds were irrigated thoroughly, and  closed in standard layered fashion using 0 Vicryl, 2-0 Vicryl, and 3-0 Nylon with 2-0 nylon for the heel.  Sterile gently compressive dressing was applied and posterior stirrup and splint.  The patient was taken to PACU in stable condition.  PROGNOSIS:  The patient will do well if he is able to maintain compliance with weightbearing restrictions and avoid substance abuse.  He will be nonweightbearing for 8 weeks and then weightbearing in protected fashion for the ensuing 4 weeks.  Return to work when pain allows.  He will be on formal pharmacologic DVT prophylaxis.  We anticipate a 2 day stay.   Doralee Albino. Carola Frost, M.D.

## 2016-11-28 ENCOUNTER — Encounter (HOSPITAL_COMMUNITY): Payer: Self-pay | Admitting: Orthopedic Surgery

## 2016-11-28 LAB — CBC
HCT: 37.5 % — ABNORMAL LOW (ref 39.0–52.0)
Hemoglobin: 12 g/dL — ABNORMAL LOW (ref 13.0–17.0)
MCH: 26.3 pg (ref 26.0–34.0)
MCHC: 32 g/dL (ref 30.0–36.0)
MCV: 82.2 fL (ref 78.0–100.0)
Platelets: 236 10*3/uL (ref 150–400)
RBC: 4.56 MIL/uL (ref 4.22–5.81)
RDW: 18.1 % — ABNORMAL HIGH (ref 11.5–15.5)
WBC: 12.9 10*3/uL — ABNORMAL HIGH (ref 4.0–10.5)

## 2016-11-28 LAB — BASIC METABOLIC PANEL
ANION GAP: 8 (ref 5–15)
BUN: 6 mg/dL (ref 6–20)
CALCIUM: 8.7 mg/dL — AB (ref 8.9–10.3)
CHLORIDE: 102 mmol/L (ref 101–111)
CO2: 28 mmol/L (ref 22–32)
Creatinine, Ser: 0.79 mg/dL (ref 0.61–1.24)
GFR calc non Af Amer: >60 mL/min (ref >60)
Glucose, Bld: 99 mg/dL (ref 65–99)
POTASSIUM: 4 mmol/L (ref 3.5–5.1)
Sodium: 138 mmol/L (ref 135–145)

## 2016-11-28 MED ORDER — HYDROMORPHONE HCL 1 MG/ML IJ SOLN
1.0000 mg | INTRAMUSCULAR | Status: DC | PRN
  Administered 2016-11-28: 1 mg via INTRAVENOUS
  Administered 2016-11-28 – 2016-11-29 (×3): 2 mg via INTRAVENOUS
  Filled 2016-11-28 (×3): qty 2
  Filled 2016-11-28: qty 1

## 2016-11-28 MED ORDER — OXYCODONE HCL 5 MG PO TABS
5.0000 mg | ORAL_TABLET | ORAL | Status: DC | PRN
  Administered 2016-11-28 – 2016-11-29 (×8): 10 mg via ORAL
  Filled 2016-11-28 (×8): qty 2

## 2016-11-28 NOTE — Evaluation (Signed)
Physical Therapy Evaluation Patient Details Name: Corey Moss MRN: [id number] DOB: [birth year] Today's Date: 11/28/2016   History of Present Illness  Pt is 46 yo male with complaint of R foot pain secondary to R distal tibia malunion, R ankle post traumatic arthritis. PMH significant for TBI, and prior TB.    Clinical Impression  Patient is s/p above surgery resulting in functional limitations due to the deficits listed below (see PT Problem List). Pt is mod I for bed mobility, and min A for transfers with RW and ambulation of 8 feet with RW. Patient will benefit from skilled PT to increase their independence and safety with mobility to allow discharge to the venue listed below.       Follow Up Recommendations Home health PT;Supervision/Assistance - 24 hour    Equipment Recommendations  Rolling walker with 5" wheels;3in1 (PT)    Recommendations for Other Services OT consult     Precautions / Restrictions Precautions Precautions: Fall Restrictions Weight Bearing Restrictions: Yes RLE Weight Bearing: Non weight bearing      Mobility  Bed Mobility Overal bed mobility: Needs Assistance Bed Mobility: Supine to Sit     Supine to sit: Modified independent (Device/Increase time);HOB elevated     General bed mobility comments: pt able to pull himself to edge of bed with HoB elevated and use of the bedrails  Transfers Overall transfer level: Needs assistance Equipment used: Rolling walker (2 wheeled) Transfers: Sit to/from Stand Sit to Stand: Min assist         General transfer comment: minA for power up and steadying in upright vc for hand placement for push up from the bed surface  Ambulation/Gait Ambulation/Gait assistance: Min assist Ambulation Distance (Feet): 8 Feet Assistive device: Rolling walker (2 wheeled) Gait Pattern/deviations:  (hop to pattern to maintain R LE NWB) Gait velocity: slowed Gait velocity interpretation: Below normal speed for  age/gender General Gait Details: slow, steady advancement, vc for sequencing and minA for steadying         Balance Overall balance assessment: Needs assistance Sitting-balance support: Bilateral upper extremity supported;No upper extremity supported;Feet supported Sitting balance-Leahy Scale: Good Sitting balance - Comments: able to sit EoB and raise arms for donning of PT belt without LoB   Standing balance support: Bilateral upper extremity supported Standing balance-Leahy Scale: Poor Standing balance comment: requires RW for maintaining static stand due to R LE NWB                             Pertinent Vitals/Pain Pain Assessment: 0-10 Pain Score: 10-Worst pain ever Pain Location: R knee 8/10 pan in ankle Pain Descriptors / Indicators: Constant;Throbbing;Sharp Pain Intervention(s): Monitored during session;Limited activity within patient's tolerance;Repositioned;Ice applied  SaO2 >94%O2 on RA throughout therapy session    Home Living Family/patient expects to be discharged to:: Private residence Living Arrangements: Spouse/significant other;Other relatives Available Help at Discharge: Available 24 hours/day;Family Type of Home: Mobile home Home Access: Ramped entrance     Home Layout: One level Home Equipment: Grab bars - toilet;Hand held shower head      Prior Function Level of Independence: Independent with assistive device(s)         Comments: used cruthches, community ambulator,     Hand Dominance   Dominant Hand: Right    Extremity/Trunk Assessment   Upper Extremity Assessment Upper Extremity Assessment: Defer to OT evaluation    Lower Extremity Assessment Lower Extremity Assessment: RLE deficits/detail RLE Deficits /  Details: R hip, knee and ankle ROM and strength are limited by surgical intervention at R ankle RLE: Unable to fully assess due to pain RLE Sensation: decreased light touch (in the anterior medial side of the lower leg)     Cervical / Trunk Assessment Cervical / Trunk Assessment: Normal  Communication   Communication: No difficulties  Cognition Arousal/Alertness: Awake/alert Behavior During Therapy: WFL for tasks assessed/performed Overall Cognitive Status: Within Functional Limits for tasks assessed                                               Assessment/Plan    PT Assessment Patient needs continued PT services  PT Problem List Decreased strength;Decreased range of motion;Decreased activity tolerance;Decreased balance;Decreased mobility;Decreased knowledge of use of DME;Decreased safety awareness;Decreased knowledge of precautions;Impaired sensation;Pain       PT Treatment Interventions DME instruction;Gait training;Functional mobility training;Therapeutic activities;Therapeutic exercise;Balance training;Patient/family education    PT Goals (Current goals can be found in the Care Plan section)  Acute Rehab PT Goals Patient Stated Goal: have less pain PT Goal Formulation: With patient Time For Goal Achievement: 12/05/16 Potential to Achieve Goals: Good    Frequency Min 5X/week    AM-PAC PT "6 Clicks" Daily Activity  Outcome Measure Difficulty turning over in bed (including adjusting bedclothes, sheets and blankets)?: A Lot Difficulty moving from lying on back to sitting on the side of the bed? : A Lot Difficulty sitting down on and standing up from a chair with arms (e.g., wheelchair, bedside commode, etc,.)?: A Lot Help needed moving to and from a bed to chair (including a wheelchair)?: A Little Help needed walking in hospital room?: A Little Help needed climbing 3-5 steps with a railing? : Total 6 Click Score: 13    End of Session Equipment Utilized During Treatment: Gait belt Activity Tolerance: Patient limited by pain Patient left: in chair;with call bell/phone within reach Nurse Communication: Mobility status;Weight bearing status (request for removal of  catheter) PT Visit Diagnosis: Unsteadiness on feet (R26.81);Other abnormalities of gait and mobility (R26.89);Pain;Muscle weakness (generalized) (M62.81) Pain - Right/Left: Right Pain - part of body: Knee;Ankle and joints of foot    Time: 1436-1510 PT Time Calculation (min) (ACUTE ONLY): 34 min   Charges:   PT Evaluation $PT Eval Low Complexity: 1 Procedure PT Treatments $Gait Training: 8-22 mins   PT G Codes:        Makenzie Vittorio B. Beverely RisenVan Fleet PT, DPT Acute Rehabilitation  [phone] Pager [phone]    Elon Alaslizabeth B Van Fleet 11/28/2016, 4:50 PM

## 2016-11-28 NOTE — Progress Notes (Signed)
Patient's PCA discontinued this morning by Montez MoritaKeith Paul PA and replaced with oxycodone PRN order. Patient received maximum dose of oxy and norco this morning and not currently due for any more until 1300 but rating pain 10/10. Paged Mellody DanceKeith and received order for PRN dilaudid for breakthrough pain. Will add this medication, continue ice and elevation of extremity, and continue to monitor patient.

## 2016-11-28 NOTE — Anesthesia Postprocedure Evaluation (Addendum)
Anesthesia Post Note  Patient: Corey Moss  Procedure(s) Performed: Procedure(s) (LRB): ARTHRODESIS HIND FOOT FUSION (Right) HARDWARE REMOVAL RIGHT ANKLE (Right)  Patient location during evaluation: PACU Anesthesia Type: General Level of consciousness: awake Pain management: pain level controlled Vital Signs Assessment: post-procedure vital signs reviewed and stable Respiratory status: spontaneous breathing Cardiovascular status: stable Postop Assessment: no signs of nausea or vomiting Anesthetic complications: no        Last Vitals:  Vitals:   11/28/16 0909 11/28/16 1406  BP:  125/62  Pulse:  86  Resp: 20 20  Temp:  36.9 C    Last Pain:  Vitals:   11/28/16 1423  TempSrc:   PainSc: 10-Worst pain ever   Pain Goal: Patients Stated Pain Goal: 3 (11/28/16 0118)               Quindell Shere JR,JOHN Susann GivensFRANKLIN

## 2016-11-28 NOTE — Progress Notes (Signed)
Orthopedic Trauma Service Progress Note    Subjective:   Doing ok Pain in ankle tolerable  Actually c/o R knee pain     Review of Systems  Constitutional: Negative for chills and fever.  Respiratory: Negative for shortness of breath and wheezing.   Cardiovascular: Negative for chest pain and palpitations.  Neurological: Negative for tingling and sensory change.    Objective:   VITALS:   Vitals:   11/27/16 2023 11/28/16 0019 11/28/16 0400 11/28/16 0429  BP: 111/65 (!) 104/54  (!) 98/56  Pulse: 94 84  86  Resp: 18 (!) 22 19 17   Temp: 98 F (36.7 C) 98.6 F (37 C)  98.7 F (37.1 C)  TempSrc: Oral Oral  Oral  SpO2: 98% 99% 100% 93%  Weight:      Height:        Intake/Output      05/15 0701 - 05/16 0700 05/16 0701 - 05/17 0700   P.O. 240    I.V. (mL/kg) 2600 (24.9)    IV Piggyback 250    Total Intake(mL/kg) 3090 (29.6)    Urine (mL/kg/hr) 2020 (0.8)    Blood 100 (0)    Total Output 2120     Net +970            LABS  Results for orders placed or performed during the hospital encounter of 11/27/16 (from the past 24 hour(s))  Basic metabolic panel     Status: Abnormal   Collection Time: 11/28/16  6:00 AM  Result Value Ref Range   Sodium 138 135 - 145 mmol/L   Potassium 4.0 3.5 - 5.1 mmol/L   Chloride 102 101 - 111 mmol/L   CO2 28 22 - 32 mmol/L   Glucose, Bld 99 65 - 99 mg/dL   BUN 6 6 - 20 mg/dL   Creatinine, Ser 0.980.79 0.61 - 1.24 mg/dL   Calcium 8.7 (L) 8.9 - 10.3 mg/dL   GFR calc non Af Amer >60 >60 mL/min   GFR calc Af Amer >60 >60 mL/min   Anion gap 8 5 - 15  CBC     Status: Abnormal   Collection Time: 11/28/16  6:00 AM  Result Value Ref Range   WBC 12.9 (H) 4.0 - 10.5 K/uL   RBC 4.56 4.22 - 5.81 MIL/uL   Hemoglobin 12.0 (L) 13.0 - 17.0 g/dL   HCT 11.937.5 (L) 14.739.0 - 82.952.0 %   MCV 82.2 78.0 - 100.0 fL   MCH 26.3 26.0 - 34.0 pg   MCHC 32.0 30.0 - 36.0 g/dL   RDW 56.218.1 (H) 13.011.5 - 86.515.5 %   Platelets 236 150 - 400 K/uL      PHYSICAL EXAM:   Gen: resting comfortably in bed, NAD Lungs: clear anterior fields, breathing unlabored Cardiac: RRR Abd: + BS, NTND Ext:       Right Lower Extremity   Splint c/d/i  Ext warm  Distal motor and sensory functions intact  No significant knee effusion   TTP anterior knee, not tender over joint line  Well healed surgical wound to anterior knee noted  No pain with gentle manipulation    Assessment/Plan: 1 Day Post-Op   Active Problems:   Nicotine dependence   Open intra-articular fracture of distal tibia, right, type I or II, with malunion, subsequent encounter   Anti-infectives    Start     Dose/Rate Route Frequency Ordered Stop   11/27/16 1800  ceFAZolin (ANCEF) IVPB 1 g/50 mL premix  1 g 100 mL/hr over 30 Minutes Intravenous Every 6 hours 11/27/16 1605 11/28/16 0550   11/27/16 0747  ceFAZolin (ANCEF) IVPB 2g/100 mL premix     2 g 200 mL/hr over 30 Minutes Intravenous On call to O.R. 11/27/16 1610 11/27/16 1052    .  POD/HD#: 1  46 y/o male with R distal tibia-fibula malunion and post-traumatic ankle arthritis following open fracture dislocation R distal tibia-fibula    -R distal tibia-fibula malunion and post-traumatic ankle arthritis s/p ROH and hindfoot fusion   NWB x 8 weeks  Splint x 2 weeks  Ice and elevate  PT/OT evals   - R knee pain   Could be referred pain from surgery   Continue with ice for symptom management   - Pain management:  Dc pca  norco   Oxy IR for breakthrough pain   - ABL anemia/Hemodynamics  Stable  Cbc in am   - DVT/PE prophylaxis:  lovenox while inpatient   Will place on asa 325 mg BID x 6 weeks at dc as pt has no insurance coverage   - ID:   periop abx    - Activity:  NWB R leg  - FEN/GI prophylaxis/Foley/Lines:  Reg diet  - Dispo:  Continue with inpatient care  Likely dc home tomorrow      Corey Latin, PA-C [facility] [phone] (P) [phone] (O) 11/28/2016, 8:09  AM

## 2016-11-28 NOTE — Evaluation (Signed)
Occupational Therapy Evaluation Patient Details Name: Corey Moss MRN: [id number] DOB: [birth year] Today's Date: 11/28/2016    History of Present Illness Pt is 46 yo male with complaint of R foot pain secondary to R distal tibia malunion, R ankle post traumatic arthritis. PMH significant for TBI, and prior TB.     Clinical Impression   PTA, pt was living with his wife and was independent. Currently, performing ADLs and fucntional mobility with RW and Min guard. Pt would benefit from further OT to address shower transfer ans facilitate safe dc. Recommend dc home once medically stable per physician.     Follow Up Recommendations  No OT follow up;Supervision - Intermittent    Equipment Recommendations  3 in 1 bedside commode    Recommendations for Other Services       Precautions / Restrictions Precautions Precautions: Fall Restrictions Weight Bearing Restrictions: Yes RLE Weight Bearing: Non weight bearing      Mobility Bed Mobility Overal bed mobility: Needs Assistance Bed Mobility: Supine to Sit     Supine to sit: Modified independent (Device/Increase time);HOB elevated     General bed mobility comments: In recliner upon arrival  Transfers Overall transfer level: Needs assistance Equipment used: Rolling walker (2 wheeled) Transfers: Sit to/from Stand Sit to Stand: Min guard         General transfer comment: Min guard for safety    Balance Overall balance assessment: Needs assistance Sitting-balance support: Bilateral upper extremity supported;No upper extremity supported;Feet supported Sitting balance-Leahy Scale: Good Sitting balance - Comments: able to sit EoB and raise arms for donning of PT belt without LoB   Standing balance support: Bilateral upper extremity supported Standing balance-Leahy Scale: Fair Standing balance comment: Able to manage pants while maintaining standing balance                           ADL either performed or  assessed with clinical judgement   ADL Overall ADL's : Needs assistance/impaired                       Lower Body Dressing Details (indicate cue type and reason): Educated on LB dressing               General ADL Comments: Pt is supervision-Min guard throughout session. Performed LB dressing, toilet transfer, and functional mobility using RW with Min guard and adhering to NWB precautions     Vision         Perception     Praxis      Pertinent Vitals/Pain Pain Assessment: 0-10 Pain Score: 8  Pain Location: R knee 8/10 pan in ankle Pain Descriptors / Indicators: Constant;Throbbing;Sharp Pain Intervention(s): Monitored during session     Hand Dominance Right   Extremity/Trunk Assessment Upper Extremity Assessment Upper Extremity Assessment: Overall WFL for tasks assessed   Lower Extremity Assessment Lower Extremity Assessment: Defer to PT evaluation RLE Deficits / Details: R hip, knee and ankle ROM and strength are limited by surgical intervention at R ankle RLE: Unable to fully assess due to pain RLE Sensation: decreased light touch (in the anterior medial side of the lower leg)   Cervical / Trunk Assessment Cervical / Trunk Assessment: Normal   Communication Communication Communication: No difficulties   Cognition Arousal/Alertness: Awake/alert Behavior During Therapy: WFL for tasks assessed/performed Overall Cognitive Status: Within Functional Limits for tasks assessed  General Comments       Exercises     Shoulder Instructions      Home Living Family/patient expects to be discharged to:: Private residence Living Arrangements: Spouse/significant other;Other relatives Available Help at Discharge: Available 24 hours/day;Family Type of Home: Mobile home Home Access: Ramped entrance     Home Layout: One level     Bathroom Shower/Tub: Producer, television/film/videoWalk-in shower   Bathroom Toilet: Standard Bathroom  Accessibility: Yes   Home Equipment: Grab bars - toilet;Hand held shower head;Grab bars - tub/shower          Prior Functioning/Environment Level of Independence: Independent with assistive device(s)        Comments: used cruthches, community ambulator,        OT Problem List: Decreased strength;Decreased activity tolerance;Impaired balance (sitting and/or standing);Decreased safety awareness;Decreased knowledge of use of DME or AE;Decreased knowledge of precautions;Pain      OT Treatment/Interventions: Self-care/ADL training;Therapeutic exercise;Energy conservation;DME and/or AE instruction;Therapeutic activities;Patient/family education    OT Goals(Current goals can be found in the care plan section) Acute Rehab OT Goals Patient Stated Goal: have less pain OT Goal Formulation: With patient Time For Goal Achievement: 12/12/16 Potential to Achieve Goals: Good ADL Goals Pt Will Perform Grooming: with supervision;standing Pt Will Perform Lower Body Dressing: with supervision;sit to/from stand Pt Will Perform Tub/Shower Transfer: Shower transfer;3 in 1;ambulating;rolling walker;with min guard assist  OT Frequency: Min 2X/week   Barriers to D/C:            Co-evaluation              AM-PAC PT "6 Clicks" Daily Activity     Outcome Measure Help from another person eating meals?: None Help from another person taking care of personal grooming?: A Little Help from another person toileting, which includes using toliet, bedpan, or urinal?: A Little Help from another person bathing (including washing, rinsing, drying)?: A Little Help from another person to put on and taking off regular upper body clothing?: A Little Help from another person to put on and taking off regular lower body clothing?: A Little 6 Click Score: 19   End of Session Equipment Utilized During Treatment: Gait belt;Rolling walker Nurse Communication: Mobility status;Precautions;Weight bearing  status  Activity Tolerance: Patient tolerated treatment well Patient left: in chair;with call bell/phone within reach  OT Visit Diagnosis: Unsteadiness on feet (R26.81);Other abnormalities of gait and mobility (R26.89);Muscle weakness (generalized) (M62.81);Pain Pain - Right/Left: Right Pain - part of body: Leg                Time: [id number] OT Time Calculation (min): 16 min Charges:  OT General Charges $OT Visit: 1 Procedure OT Evaluation $OT Eval Low Complexity: 1 Procedure G-Codes:     Emori Mumme, OTR/L [phone]  Theodoro GristCharis M Taleshia Luff 11/28/2016, 5:00 PM

## 2016-11-29 ENCOUNTER — Encounter (HOSPITAL_COMMUNITY): Payer: Self-pay | Admitting: Orthopedic Surgery

## 2016-11-29 DIAGNOSIS — M19171 Post-traumatic osteoarthritis, right ankle and foot: Secondary | ICD-10-CM

## 2016-11-29 HISTORY — DX: Post-traumatic osteoarthritis, right ankle and foot: M19.171

## 2016-11-29 LAB — BASIC METABOLIC PANEL
ANION GAP: 8 (ref 5–15)
BUN: 7 mg/dL (ref 6–20)
CHLORIDE: 102 mmol/L (ref 101–111)
CO2: 26 mmol/L (ref 22–32)
Calcium: 8.7 mg/dL — ABNORMAL LOW (ref 8.9–10.3)
Creatinine, Ser: 0.81 mg/dL (ref 0.61–1.24)
GFR calc non Af Amer: >60 mL/min (ref >60)
Glucose, Bld: 119 mg/dL — ABNORMAL HIGH (ref 65–99)
POTASSIUM: 4 mmol/L (ref 3.5–5.1)
SODIUM: 136 mmol/L (ref 135–145)

## 2016-11-29 MED ORDER — ASPIRIN EC 325 MG PO TBEC: 325.0000 mg | DELAYED_RELEASE_TABLET | Freq: Two times a day (BID) | ORAL | 0 refills | Status: DC

## 2016-11-29 MED ORDER — OXYCODONE HCL 5 MG PO TABS: 5.0000 mg | ORAL_TABLET | ORAL | 0 refills | Status: DC | PRN

## 2016-11-29 MED ORDER — ASPIRIN 325 MG PO TABS: 325.0000 mg | ORAL_TABLET | Freq: Two times a day (BID) | ORAL | Status: DC

## 2016-11-29 MED ORDER — HYDROCODONE-ACETAMINOPHEN 7.5-325 MG PO TABS: 1.0000 | ORAL_TABLET | Freq: Four times a day (QID) | ORAL | 0 refills | Status: DC | PRN

## 2016-11-29 MED ORDER — DOCUSATE SODIUM 100 MG PO CAPS: 100.0000 mg | ORAL_CAPSULE | Freq: Two times a day (BID) | ORAL | 0 refills | Status: DC

## 2016-11-29 MED ORDER — METHOCARBAMOL 500 MG PO TABS: 500.0000 mg | ORAL_TABLET | Freq: Four times a day (QID) | ORAL | 0 refills | Status: AC | PRN

## 2016-11-29 NOTE — Progress Notes (Signed)
Occupational Therapy Treatment Patient Details Name: Corey Moss MRN: [id number] DOB: Jun 06, 1971 Today's Date: 11/29/2016    History of present illness Pt is 46 yo male with complaint of R foot pain secondary to R distal tibia malunion, R ankle post traumatic arthritis. PMH significant for TBI, and prior TB.     OT comments  Pt progressing towards acute OT goals. Focus of session was shower transfer. D/c plan remains appropriate.   Follow Up Recommendations  No OT follow up;Supervision - Intermittent    Equipment Recommendations  3 in 1 bedside commode    Recommendations for Other Services      Precautions / Restrictions Precautions Precautions: Fall Restrictions Weight Bearing Restrictions: Yes RLE Weight Bearing: Non weight bearing       Mobility Bed Mobility               General bed mobility comments: In recliner upon arrival  Transfers Overall transfer level: Needs assistance Equipment used: Rolling walker (2 wheeled) Transfers: Sit to/from Stand Sit to Stand: Min guard         General transfer comment: Min guard for safety    Balance Overall balance assessment: Needs assistance Sitting-balance support: Bilateral upper extremity supported;No upper extremity supported;Feet supported Sitting balance-Leahy Scale: Good Sitting balance - Comments: able to sit EoB and raise arms for donning of PT belt without LoB   Standing balance support: Bilateral upper extremity supported Standing balance-Leahy Scale: Fair                             ADL either performed or assessed with clinical judgement   ADL Overall ADL's : Needs assistance/impaired                                 Tub/ Shower Transfer: Walk-in shower;Min guard;Ambulation;3 in 1;Rolling walker Tub/Shower Transfer Details (indicate cue type and reason): Simulated in room. Discussed 2 options for transfer depending on size of shower. Dicussed having family member  do trial run with rw prior to pt completing transfer to work out any issues with equipment and setup first.  Functional mobility during ADLs: Min guard;Rolling walker General ADL Comments: reviewed ADL education, discussed and practice simulated shower trasnfer. Pt able to off load through BUE well to complete stepping into shower.      Vision       Perception     Praxis      Cognition Arousal/Alertness: Awake/alert Behavior During Therapy: WFL for tasks assessed/performed Overall Cognitive Status: Within Functional Limits for tasks assessed                                          Exercises     Shoulder Instructions       General Comments      Pertinent Vitals/ Pain       Pain Assessment: Faces Faces Pain Scale: Hurts little more Pain Location: RLE Pain Descriptors / Indicators: Operative site guarding;Grimacing Pain Intervention(s): Limited activity within patient's tolerance;Monitored during session;Repositioned  Home Living                                          Prior Functioning/Environment  Frequency  Min 2X/week        Progress Toward Goals  OT Goals(current goals can now be found in the care plan section)  Progress towards OT goals: Progressing toward goals  Acute Rehab OT Goals Patient Stated Goal: have less pain OT Goal Formulation: With patient Time For Goal Achievement: 12/12/16 Potential to Achieve Goals: Good ADL Goals Pt Will Perform Grooming: with supervision;standing Pt Will Perform Lower Body Dressing: with supervision;sit to/from stand Pt Will Perform Tub/Shower Transfer: Shower transfer;3 in 1;ambulating;rolling walker;with min guard assist  Plan Discharge plan remains appropriate    Co-evaluation                 AM-PAC PT "6 Clicks" Daily Activity     Outcome Measure   Help from another person eating meals?: None Help from another person taking care of personal  grooming?: A Little Help from another person toileting, which includes using toliet, bedpan, or urinal?: A Little Help from another person bathing (including washing, rinsing, drying)?: A Little Help from another person to put on and taking off regular upper body clothing?: A Little Help from another person to put on and taking off regular lower body clothing?: A Little 6 Click Score: 19    End of Session Equipment Utilized During Treatment: Gait belt;Rolling walker  OT Visit Diagnosis: Unsteadiness on feet (R26.81);Other abnormalities of gait and mobility (R26.89);Muscle weakness (generalized) (M62.81);Pain Pain - Right/Left: Right Pain - part of body: Leg   Activity Tolerance Patient tolerated treatment well   Patient Left in chair;with call bell/phone within reach   Nurse Communication          Time: [id number] OT Time Calculation (min): 16 min  Charges: OT General Charges $OT Visit: 1 Procedure OT Treatments $Self Care/Home Management : 8-22 mins     [name], [name] 11/29/2016, 1:26 PM

## 2016-11-29 NOTE — Progress Notes (Signed)
D/c iv in right hand, noted there is not an IV in his left hand

## 2016-11-29 NOTE — Care Management Note (Signed)
Case Management Note  Patient Details  Name: Corey Moss MRN: [id number] Date of Birth: 1970-12-21  Subjective/Objective:   46 yr old male s/p  ARTHRODESIS HIND FOOT FUSION (Right) with [employer] fusion nail 11 x 180 compressed and statically locked. 2. OSTEOTOMY TIBIA AND FIBULA RIGHT 3. HARDWARE REMOVAL RIGHT ANKLE (Right)        Action/Plan: Patient doesn't qualify for Home Health therapy, he is uninsured and doesn't qualify for charity with [facility]. CM has requested 3in1 and rolling walker. Patient states he does not need a wheelchair. Will have family support at discharge.     Expected Discharge Date:  11/29/16               Expected Discharge Plan:  Home/Self Care  In-House Referral:  NA  Discharge planning Services  CM Consult  Post Acute Care Choice:  Durable Medical Equipment Choice offered to:  NA ([facility]  DME Arranged:  3-N-1, Wheelchair manual DME Agency:  [facility]  HH Arranged:  NA HH Agency:  NA  Status of Service:  Completed, signed off  If discussed at Long Length of Stay Meetings, dates discussed:    Additional Comments:  Corey Moss, Corey Schoenberger Naomi, RN 11/29/2016, 11:57 AM

## 2016-11-29 NOTE — Discharge Instructions (Signed)
Orthopaedic Trauma Service Discharge Instructions   General Discharge Instructions  WEIGHT BEARING STATUS: Nonweightbearing Right leg   RANGE OF MOTION/ACTIVITY: do not remove splint  Wound Care: do not remove splint, keep splint clean and dry   PAIN MEDICATION USE AND EXPECTATIONS  You have likely been given narcotic medications to help control your pain.  After a traumatic event that results in an fracture (broken bone) with or without surgery, it is ok to use narcotic pain medications to help control one's pain.  We understand that everyone responds to pain differently and each individual patient will be evaluated on a regular basis for the continued need for narcotic medications. Ideally, narcotic medication use should last no more than 6-8 weeks (coinciding with fracture healing).   As a patient it is your responsibility as well to monitor narcotic medication use and report the amount and frequency you use these medications when you come to your office visit.   We would also advise that if you are using narcotic medications, you should take a dose prior to therapy to maximize you participation.  IF YOU ARE ON NARCOTIC MEDICATIONS IT IS NOT PERMISSIBLE TO OPERATE A MOTOR VEHICLE (MOTORCYCLE/CAR/TRUCK/MOPED) OR HEAVY MACHINERY DO NOT MIX NARCOTICS WITH OTHER CNS (CENTRAL NERVOUS SYSTEM) DEPRESSANTS SUCH AS ALCOHOL  Diet: as you were eating previously.  Can use over the counter stool softeners and bowel preparations, such as Miralax, to help with bowel movements.  Narcotics can be constipating.  Be sure to drink plenty of fluids    STOP SMOKING OR USING NICOTINE PRODUCTS!!!!  As discussed nicotine severely impairs your body's ability to heal surgical and traumatic wounds but also impairs bone healing.  Wounds and bone heal by forming microscopic blood vessels (angiogenesis) and nicotine is a vasoconstrictor (essentially, shrinks blood vessels).  Therefore, if vasoconstriction occurs to  these microscopic blood vessels they essentially disappear and are unable to deliver necessary nutrients to the healing tissue.  This is one modifiable factor that you can do to dramatically increase your chances of healing your injury.    (This means no smoking, no nicotine gum, patches, etc)  DO NOT USE NONSTEROIDAL ANTI-INFLAMMATORY DRUGS (NSAID'S)  Using products such as Advil (ibuprofen), Aleve (naproxen), Motrin (ibuprofen) for additional pain control during fracture healing can delay and/or prevent the healing response.  If you would like to take over the counter (OTC) medication, Tylenol (acetaminophen) is ok.  However, some narcotic medications that are given for pain control contain acetaminophen as well. Therefore, you should not exceed more than 4000 mg of tylenol in a day if you do not have liver disease.  Also note that there are may OTC medicines, such as cold medicines and allergy medicines that my contain tylenol as well.  If you have any questions about medications and/or interactions please ask your doctor/PA or your pharmacist.      ICE AND ELEVATE INJURED/OPERATIVE EXTREMITY  Using ice and elevating the injured extremity above your heart can help with swelling and pain control.  Icing in a pulsatile fashion, such as 20 minutes on and 20 minutes off, can be followed.    Do not place ice directly on skin. Make sure there is a barrier between to skin and the ice pack.    Using frozen items such as frozen peas works well as the conform nicely to the are that needs to be iced.  USE AN ACE WRAP OR TED HOSE FOR SWELLING CONTROL  In addition to icing and elevation,  Ace wraps or TED hose are used to help limit and resolve swelling.  It is recommended to use Ace wraps or TED hose until you are informed to stop.    When using Ace Wraps start the wrapping distally (farthest away from the body) and wrap proximally (closer to the body)   Example: If you had surgery on your leg or thing and you  do not have a splint on, start the ace wrap at the toes and work your way up to the thigh        If you had surgery on your upper extremity and do not have a splint on, start the ace wrap at your fingers and work your way up to the upper arm  IF YOU ARE IN A SPLINT OR CAST DO NOT REMOVE IT FOR ANY REASON   If your splint gets wet for any reason please contact the office immediately. You may shower in your splint or cast as long as you keep it dry.  This can be done by wrapping in a cast cover or garbage back (or similar)  Do Not stick any thing down your splint or cast such as pencils, money, or hangers to try and scratch yourself with.  If you feel itchy take benadryl as prescribed on the bottle for itching  IF YOU ARE IN A CAM BOOT (BLACK BOOT)  You may remove boot periodically. Perform daily dressing changes as noted below.  Wash the liner of the boot regularly and wear a sock when wearing the boot. It is recommended that you sleep in the boot until told otherwise  CALL THE OFFICE WITH ANY QUESTIONS OR CONCERNS: [phone]

## 2016-11-29 NOTE — Progress Notes (Signed)
Reviewed discharge papers along with medications orders with full understanding

## 2016-11-29 NOTE — Discharge Summary (Signed)
Orthopaedic Trauma Service (OTS)  [name] MRN: [id number] DOB/AGE: 1970/12/24 46 y.o.  Admit date: 11/27/2016 Discharge date: 11/29/2016  Admission Diagnoses: Malunion open right distal tibia and fibula fracture Posttraumatic arthritis right ankle Retained hardware right ankle Nicotine dependence   Discharge Diagnoses:  Principal Problem:   Open intra-articular fracture of distal tibia, right, type I or II, with malunion, subsequent encounter Active Problems:   Nicotine dependence   Post-traumatic arthritis of right ankle   Procedures Performed: 11/27/2016- Dr. Marcelino Scot 1. Removal of hardware right ankle 2. Osteotomy right tibia and fibula 3. Hindfoot fusion with fusion nail  Discharged Condition: good  Hospital Course:   46 year old white male well known to the orthopedic trauma service for open fracture dislocation of his right ankle sustained in December 2017. Patient had numerous procedures on his right ankle for limb salvage and ultimately reconstruction of his complex pilon fracture. Patient was noncompliant and begin weightbearing almost immediately after surgery. He ultimately developed a malunion and severe posttraumatic arthritis. Conservative measures were attempted to manage his symptoms but his pain was too severe and impacting his ADLs. As such we felt that a hindfoot fusion was best course of treatment. Patient was taken to the OR on 11/27/2016 for the procedures noted above. Patient's hospital stay was uncomplicated. He was started on Lovenox for DVT and PE prophylaxis on postoperative day #1. He was covered with antibiotics for routine perioperative antibiotic coverage. He began to work with physical therapies on postoperative day #1 and did well. Pain control was a little bit of an issue on postoperative day #1 and still requiring IV pain medication to get ample relief. Patient splint was changed on postoperative day #2 as he didn't drain  through home. His wounds were stable at the time of dressing change and he was placed into a new splint. On postoperative day #2 patient was deemed to be stable for discharge to home. No additional issues were noted at the time of discharge. At the time of discharge patient was tolerating regular diet and voiding without difficulty  Due to financial constraints patient was converted to aspirin 325 mg by mouth twice daily for DVT and PE prophylaxis. He was covered with Lovenox during his inpatient stay  Consults: None  Significant Diagnostic Studies: labs:  Results for Corey Moss (MRN [id number]) as of 11/29/2016 11:44  Ref. Range 11/28/2016 06:00 11/29/2016 06:15  Sodium Latest Ref Range: 135 - 145 mmol/L 138 136  Potassium Latest Ref Range: 3.5 - 5.1 mmol/L 4.0 4.0  Chloride Latest Ref Range: 101 - 111 mmol/L 102 102  CO2 Latest Ref Range: 22 - 32 mmol/L 28 26  Glucose Latest Ref Range: 65 - 99 mg/dL 99 119 (H)  BUN Latest Ref Range: 6 - 20 mg/dL 6 7  Creatinine Latest Ref Range: 0.61 - 1.24 mg/dL 0.79 0.81  Calcium Latest Ref Range: 8.9 - 10.3 mg/dL 8.7 (L) 8.7 (L)  Anion gap Latest Ref Range: 5 - _0 EGFR (African American) Latest Ref Range: >60 mL/min >60 >60  EGFR (Non-African Amer.) Latest Ref Range: >60 mL/min >60 >60  WBC Latest Ref Range: 4.0 - 10.5 K/uL 12.9 (H)   RBC Latest Ref Range: 4.22 - 5.81 MIL/uL 4.56   Hemoglobin Latest Ref Range: 13.0 - 17.0 g/dL 12.0 (L)   HCT Latest Ref Range: 39.0 - 52.0 % 37.5 (L)   MCV Latest Ref Range: 78.0 - 100.0 fL 82.2   MCH Latest Ref  Range: 26.0 - 34.0 pg 26.3   MCHC Latest Ref Range: 30.0 - 36.0 g/dL 32.0   RDW Latest Ref Range: 11.5 - 15.5 % 18.1 (H)   Platelets Latest Ref Range: 150 - 400 K/uL 236      Treatments: IV hydration, antibiotics: Ancef, analgesia: Dilaudid PCA, Norco, OxyIR, anticoagulation: LMW heparin and aspirin at discharge, therapies: PT, OT and RN and surgery: As above  Discharge Exam:          Orthopaedic Trauma Service (OTS)   Subjective: 2 Days Post-Op Procedure(s) (LRB): ARTHRODESIS HIND FOOT FUSION (Right) HARDWARE REMOVAL RIGHT ANKLE (Right) Patient reports pain as moderate.   Tolerating PO meds; ok for d/c to home today. Reports walker was accidentally run over.   Objective: Current Vitals Blood pressure (!) 122/59, pulse 86, temperature 97.9 F (36.6 C), temperature source Oral, resp. rate 16, height _0  (1.753 m), weight 104.3 kg (230 lb), SpO2 97 %. Vital signs in last 24 hours: Temp:  [97.9 F (36.6 C)-99.1 F (37.3 C)] 97.9 F (36.6 C) (05/17 0617) Pulse Rate:  [86-96] 86 (05/17 0617) Resp:  [16-20] 16 (05/17 0617) BP: (122-131)/(59-68) 122/59 (05/17 0617) SpO2:  [94 %-98 %] 97 % (05/17 0617)   Intake/Output from previous day: 05/16 0701 - 05/17 0700 In: 1952.5 [P.O.:1440; I.V.:452.5; IV Piggyback:60] Out: 3910 [Urine:3910]   LABS  Recent Labs (last 2 labs)     Recent Labs   11/27/16 0755 11/28/16 0600  HGB 14.9 12.0*       Recent Labs (last 2 labs)     Recent Labs   11/27/16 0755 11/28/16 0600  WBC 10.9* 12.9*  RBC 5.63 4.56  HCT 45.8 37.5*  PLT 245 236       Recent Labs (last 2 labs)     Recent Labs   11/28/16 0600 11/29/16 0615  NA 138 136  K 4.0 4.0  CL 102 102  CO2 28 26  BUN 6 7  CREATININE 0.79 0.81  GLUCOSE 99 119*  CALCIUM 8.7* 8.7*       Recent Labs (last 2 labs)     Recent Labs   11/27/16 0755  INR 0.98      Physical Exam   RLE     Dressing sanguinous drainage but incisions intact, clean, slight drainage today             Edema/ swelling controlled             Sens: DPN, SPN, TN intact             Motor: EHL, FHL, and lessor toe ext and flex all intact grossly             Brisk cap refill, warm to touch   Assessment/Plan: 2 Days Post-Op Procedure(s) (LRB): ARTHRODESIS HIND FOOT FUSION (Right) HARDWARE REMOVAL RIGHT ANKLE (Right)   1. I APPLIED NEW SPLINT  2. Patient to work with PT then d/c  to home; NWB RLE 3. Lovenox 4. F/u in 2-3 wks for change into cast.   [place], MD [facility], [facility] [phone] (p)     Disposition: 01-Home or Self Care  Discharge Instructions    Call MD / Call 911    Complete by:  As directed    If you experience chest pain or shortness of breath, CALL 911 and be transported to the hospital emergency room.  If you develope a fever above 101 F, pus (white drainage) or increased drainage or redness  at the wound, or calf pain, call your surgeon's office.   Constipation Prevention    Complete by:  As directed    Drink plenty of fluids.  Prune juice may be helpful.  You may use a stool softener, such as Colace (over the counter) 100 mg twice a day.  Use MiraLax (over the counter) for constipation as needed.   Diet general    Complete by:  As directed    Discharge instructions    Complete by:  As directed    Orthopaedic Trauma Service Discharge Instructions   General Discharge Instructions  WEIGHT BEARING STATUS: Nonweightbearing Right leg   RANGE OF MOTION/ACTIVITY: do not remove splint  Wound Care: do not remove splint, keep splint clean and dry   PAIN MEDICATION USE AND EXPECTATIONS  You have likely been given narcotic medications to help control your pain.  After a traumatic event that results in an fracture (broken bone) with or without surgery, it is ok to use narcotic pain medications to help control one's pain.  We understand that everyone responds to pain differently and each individual patient will be evaluated on a regular basis for the continued need for narcotic medications. Ideally, narcotic medication use should last no more than 6-8 weeks (coinciding with fracture healing).   As a patient it is your responsibility as well to monitor narcotic medication use and report the amount and frequency you use these medications when you come to your office visit.   We would also advise that if you are using  narcotic medications, you should take a dose prior to therapy to maximize you participation.  IF YOU ARE ON NARCOTIC MEDICATIONS IT IS NOT PERMISSIBLE TO OPERATE A MOTOR VEHICLE (MOTORCYCLE/CAR/TRUCK/MOPED) OR HEAVY MACHINERY DO NOT MIX NARCOTICS WITH OTHER CNS (CENTRAL NERVOUS SYSTEM) DEPRESSANTS SUCH AS ALCOHOL  Diet: as you were eating previously.  Can use over the counter stool softeners and bowel preparations, such as Miralax, to help with bowel movements.  Narcotics can be constipating.  Be sure to drink plenty of fluids    STOP SMOKING OR USING NICOTINE PRODUCTS!!!!  As discussed nicotine severely impairs your body's ability to heal surgical and traumatic wounds but also impairs bone healing.  Wounds and bone heal by forming microscopic blood vessels (angiogenesis) and nicotine is a vasoconstrictor (essentially, shrinks blood vessels).  Therefore, if vasoconstriction occurs to these microscopic blood vessels they essentially disappear and are unable to deliver necessary nutrients to the healing tissue.  This is one modifiable factor that you can do to dramatically increase your chances of healing your injury.    (This means no smoking, no nicotine gum, patches, etc)  DO NOT USE NONSTEROIDAL ANTI-INFLAMMATORY DRUGS (NSAID'S)  Using products such as Advil (ibuprofen), Aleve (naproxen), Motrin (ibuprofen) for additional pain control during fracture healing can delay and/or prevent the healing response.  If you would like to take over the counter (OTC) medication, Tylenol (acetaminophen) is ok.  However, some narcotic medications that are given for pain control contain acetaminophen as well. Therefore, you should not exceed more than 4000 mg of tylenol in a day if you do not have liver disease.  Also note that there are may OTC medicines, such as cold medicines and allergy medicines that my contain tylenol as well.  If you have any questions about medications and/or interactions please ask your  doctor/PA or your pharmacist.      ICE AND ELEVATE INJURED/OPERATIVE EXTREMITY  Using ice and elevating the injured extremity above  your heart can help with swelling and pain control.  Icing in a pulsatile fashion, such as 20 minutes on and 20 minutes off, can be followed.    Do not place ice directly on skin. Make sure there is a barrier between to skin and the ice pack.    Using frozen items such as frozen peas works well as the conform nicely to the are that needs to be iced.  USE AN ACE WRAP OR TED HOSE FOR SWELLING CONTROL  In addition to icing and elevation, Ace wraps or TED hose are used to help limit and resolve swelling.  It is recommended to use Ace wraps or TED hose until you are informed to stop.    When using Ace Wraps start the wrapping distally (farthest away from the body) and wrap proximally (closer to the body)   Example: If you had surgery on your leg or thing and you do not have a splint on, start the ace wrap at the toes and work your way up to the thigh        If you had surgery on your upper extremity and do not have a splint on, start the ace wrap at your fingers and work your way up to the upper arm  IF YOU ARE IN A SPLINT OR CAST DO NOT REMOVE IT FOR ANY REASON   If your splint gets wet for any reason please contact the office immediately. You may shower in your splint or cast as long as you keep it dry.  This can be done by wrapping in a cast cover or garbage back (or similar)  Do Not stick any thing down your splint or cast such as pencils, money, or hangers to try and scratch yourself with.  If you feel itchy take benadryl as prescribed on the bottle for itching  IF YOU ARE IN A CAM BOOT (BLACK BOOT)  You may remove boot periodically. Perform daily dressing changes as noted below.  Wash the liner of the boot regularly and wear a sock when wearing the boot. It is recommended that you sleep in the boot until told otherwise  CALL THE OFFICE WITH ANY QUESTIONS OR  CONCERNS: [phone]   Driving restrictions    Complete by:  As directed    No driving   Increase activity slowly as tolerated    Complete by:  As directed    Non weight bearing    Complete by:  As directed    Laterality:  right   Extremity:  Lower     Allergies as of 11/29/2016   No Known Allergies     Medication List    STOP taking these medications   BC HEADACHE POWDER PO   HYDROcodone-acetaminophen 5-325 MG tablet Commonly known as:  NORCO/VICODIN Replaced by:  HYDROcodone-acetaminophen 7.5-325 MG tablet     TAKE these medications   aspirin EC 325 MG tablet Take 1 tablet (325 mg total) by mouth every 12 (twelve) hours.   docusate sodium 100 MG capsule Commonly known as:  COLACE Take 1 capsule (100 mg total) by mouth 2 (two) times daily.   HYDROcodone-acetaminophen 7.5-325 MG tablet Commonly known as:  NORCO Take 1-2 tablets by mouth every 6 (six) hours as needed (breakthrough pain). Replaces:  HYDROcodone-acetaminophen 5-325 MG tablet   methocarbamol 500 MG tablet Commonly known as:  ROBAXIN Take 1-2 tablets (500-1,000 mg total) by mouth every 6 (six) hours as needed for muscle spasms.   oxyCODONE 5 MG immediate  release tablet Commonly known as:  Oxy IR/ROXICODONE Take 1-2 tablets (5-10 mg total) by mouth every 3 (three) hours as needed for breakthrough pain (take between hydrocodone for breakthrough pain only).            Durable Medical Equipment        Start     Ordered   11/29/16 1119  For home use only DME Walker rolling  Once    Question:  Patient needs a walker to treat with the following condition  Answer:  S/P ankle fusion   11/29/16 1118       Discharge Instructions and Plan:  46 y/o male with R distal tibia-fibula malunion and post-traumatic ankle arthritis following open fracture dislocation R distal tibia-fibula      -R distal tibia-fibula malunion and post-traumatic ankle arthritis s/p ROH and hindfoot fusion              NWB x  8 weeks             Splint x 2 weeks             Ice and elevate             PT/OT evals    - R knee pain               improved  Continue to monitor  - Pain management:              norco              Oxy IR for breakthrough pain   Robaxin for spasms   - ABL anemia/Hemodynamics             Stable    - DVT/PE prophylaxis:              aspirin 325 mg every 12 hours 4 weeks    - ID:              periop abx completed     - Activity:             NWB R leg   - FEN/GI prophylaxis/Foley/Lines:             Reg diet   - Dispo:              DC home today  Follow-up with orthopedics in 10-14 days  Patient will likely be placed into a short-leg cast at office follow-up  Signed:  Jari Pigg, PA-C [facility] [phone] (P) 11/29/2016, 11:40 AM

## 2016-11-29 NOTE — Progress Notes (Addendum)
Orthopaedic Trauma Service (OTS)  Subjective: 2 Days Post-Op Procedure(s) (LRB): ARTHRODESIS HIND FOOT FUSION (Right) HARDWARE REMOVAL RIGHT ANKLE (Right) Patient reports pain as moderate.   Tolerating PO meds; ok for d/c to home today. Reports walker was accidentally run over.  Objective: Current Vitals Blood pressure (!) 122/59, pulse 86, temperature 97.9 F (36.6 C), temperature source Oral, resp. rate 16, height 5\' 9"  (1.753 m), weight 104.3 kg (230 lb), SpO2 97 %. Vital signs in last 24 hours: Temp:  [97.9 F (36.6 C)-99.1 F (37.3 C)] 97.9 F (36.6 C) (05/17 0617) Pulse Rate:  [86-96] 86 (05/17 0617) Resp:  [16-20] 16 (05/17 0617) BP: (122-131)/(59-68) 122/59 (05/17 0617) SpO2:  [94 %-98 %] 97 % (05/17 0617)  Intake/Output from previous day: 05/16 0701 - 05/17 0700 In: 1952.5 [P.O.:1440; I.V.:452.5; IV Piggyback:60] Out: 3910 [Urine:3910]  LABS  Recent Labs  11/27/16 0755 11/28/16 0600  HGB 14.9 12.0*    Recent Labs  11/27/16 0755 11/28/16 0600  WBC 10.9* 12.9*  RBC 5.63 4.56  HCT 45.8 37.5*  PLT 245 236    Recent Labs  11/28/16 0600 11/29/16 0615  NA 138 136  K 4.0 4.0  CL 102 102  CO2 28 26  BUN 6 7  CREATININE 0.79 0.81  GLUCOSE 99 119*  CALCIUM 8.7* 8.7*    Recent Labs  11/27/16 0755  INR 0.98   Physical Exam  RLE Dressing sanguinous drainage but incisions intact, clean, slight drainage today  Edema/ swelling controlled  Sens: DPN, SPN, TN intact  Motor: EHL, FHL, and lessor toe ext and flex all intact grossly  Brisk cap refill, warm to touch  Assessment/Plan: 2 Days Post-Op Procedure(s) (LRB): ARTHRODESIS HIND FOOT FUSION (Right) HARDWARE REMOVAL RIGHT ANKLE (Right)  1. I APPLIED NEW SPLINT  2. Patient to work with PT then d/c to home; NWB RLE 3. Lovenox 4. F/u in 2-3 wks for change into cast.  Myrene GalasMichael Brieann Osinski, MD [facility] [phone] (p)  11/29/2016, 8:55 AM

## 2016-11-29 NOTE — Progress Notes (Signed)
Orthopedic Tech Progress Note Patient Details:  Corey FortsCharles D Moss 01/11/1971 [id number]  Ortho Devices Type of Ortho Device: Ace wrap, Post (short leg) splint, Stirrup splint Ortho Device/Splint Location: rle Ortho Device/Splint Interventions: Application   Corey Moss 11/29/2016, 9:20 AM As ordered by Dr. Carola FrostHandy

## 2016-11-29 NOTE — Progress Notes (Signed)
Physical Therapy Treatment Patient Details Name: Corey Moss MRN: [id number] DOB: 1970/08/29 Today's Date: 11/29/2016    History of Present Illness Pt is [age] yo male with complaint of R foot pain secondary to R distal tibia malunion, R ankle post traumatic arthritis. PMH significant for TBI, and prior TB.      PT Comments    Pt is making good progress towards his goals. Pt is min guard for transfers with RW and ambulation of 10 feet with RW. Pt educated on HEP to maintain R LE strength and ROM. Pt requires skilled PT to progress gait training and to maintain LE strength and ROM to improve mobility once weightbearing restrictions are lifted and to be able to safely navigate in his home environment at discharge.     Follow Up Recommendations  Home health PT;Supervision/Assistance - 24 hour     Equipment Recommendations  Rolling walker with 5" wheels;3in1 (PT)    Recommendations for Other Services OT consult     Precautions / Restrictions Precautions Precautions: Fall Restrictions Weight Bearing Restrictions: Yes RLE Weight Bearing: Non weight bearing    Mobility  Bed Mobility               General bed mobility comments: in recliner at entry  Transfers Overall transfer level: Needs assistance Equipment used: Rolling walker (2 wheeled) Transfers: Sit to/from Stand Sit to Stand: Min guard         General transfer comment: min guard for safety  Ambulation/Gait Ambulation/Gait assistance: Min guard Ambulation Distance (Feet): 20 Feet Assistive device: Rolling walker (2 wheeled) Gait Pattern/deviations:  (hop to pattern to maintain R LE NWB) Gait velocity: slowed Gait velocity interpretation: Below normal speed for age/gender General Gait Details: slow, steady advancement, min guard for safety, vc for staying within walker      Balance Overall balance assessment: Needs assistance Sitting-balance support: Bilateral upper extremity supported;No upper  extremity supported;Feet supported Sitting balance-Leahy Scale: Good Sitting balance - Comments: able to sit EoB and raise arms for donning of PT belt without LoB   Standing balance support: Bilateral upper extremity supported Standing balance-Leahy Scale: Fair Standing balance comment: requires RW for maintaining static stand due to R LE NWB                            Cognition Arousal/Alertness: Awake/alert Behavior During Therapy: WFL for tasks assessed/performed Overall Cognitive Status: Within Functional Limits for tasks assessed                                        Exercises Total Joint Exercises Ankle Circles/Pumps: AROM;Both;20 reps;Seated Quad Sets: AROM;Right;10 reps;Seated Heel Slides: AROM;Right;10 reps;Seated Hip ABduction/ADduction: AROM;Right;10 reps;Seated Knee Flexion: AROM;Right;10 reps;Seated        Pertinent Vitals/Pain Pain Assessment: 0-10 Pain Score: 7  Faces Pain Scale: Hurts little more Pain Location: R LE Pain Descriptors / Indicators: Constant;Throbbing;Tender Pain Intervention(s): Limited activity within patient's tolerance;Monitored during session  VSS           PT Goals (current goals can now be found in the care plan section) Acute Rehab PT Goals Patient Stated Goal: have less pain PT Goal Formulation: With patient Time For Goal Achievement: 12/05/16 Potential to Achieve Goals: Good Progress towards PT goals: Progressing toward goals    Frequency    Min 5X/week  PT Plan Current plan remains appropriate       AM-PAC PT "6 Clicks" Daily Activity  Outcome Measure  Difficulty turning over in bed (including adjusting bedclothes, sheets and blankets)?: A Lot Difficulty moving from lying on back to sitting on the side of the bed? : A Lot Difficulty sitting down on and standing up from a chair with arms (e.g., wheelchair, bedside commode, etc,.)?: A Lot Help needed moving to and from a bed to chair  (including a wheelchair)?: A Little Help needed walking in hospital room?: A Little Help needed climbing 3-5 steps with a railing? : Total 6 Click Score: 13    End of Session Equipment Utilized During Treatment: Gait belt Activity Tolerance: Patient limited by pain Patient left: in chair;with call bell/phone within reach Nurse Communication: Mobility status;Weight bearing status (request for removal of catheter) PT Visit Diagnosis: Unsteadiness on feet (R26.81);Other abnormalities of gait and mobility (R26.89);Pain;Muscle weakness (generalized) (M62.81) Pain - Right/Left: Right Pain - part of body: Knee;Ankle and joints of foot     Time: 4098-1191 PT Time Calculation (min) (ACUTE ONLY): 15 min  Charges:  $Gait Training: 8-22 mins                    G Codes:       Arieliz Latino B. Beverely Risen PT, DPT Acute Rehabilitation  [phone] Pager [phone]     Elon Alas Fleet 11/29/2016, 1:51 PM

## 2016-12-14 NOTE — Addendum Note (Signed)
Addendum  created 12/14/16 0834 by Tavari Loadholt D, MD   Sign clinical note    

## 2016-12-21 NOTE — Addendum Note (Signed)
Addendum  created 12/21/16 1237 by Breyon Sigg, MD   Sign clinical note    

## 2020-02-04 ENCOUNTER — Ambulatory Visit: Payer: Self-pay | Admitting: Gerontology

## 2020-02-04 ENCOUNTER — Encounter: Payer: Self-pay | Admitting: Gerontology

## 2020-02-04 ENCOUNTER — Other Ambulatory Visit: Payer: Self-pay

## 2020-02-04 VITALS — BP 102/69 | HR 79 | Ht 69.0 in | Wt 228.0 lb

## 2020-02-04 DIAGNOSIS — K219 Gastro-esophageal reflux disease without esophagitis: Secondary | ICD-10-CM | POA: Insufficient documentation

## 2020-02-04 DIAGNOSIS — Z7689 Persons encountering health services in other specified circumstances: Secondary | ICD-10-CM | POA: Insufficient documentation

## 2020-02-04 DIAGNOSIS — F17200 Nicotine dependence, unspecified, uncomplicated: Secondary | ICD-10-CM | POA: Insufficient documentation

## 2020-02-04 DIAGNOSIS — G8929 Other chronic pain: Secondary | ICD-10-CM | POA: Insufficient documentation

## 2020-02-04 DIAGNOSIS — H538 Other visual disturbances: Secondary | ICD-10-CM | POA: Insufficient documentation

## 2020-02-04 DIAGNOSIS — M25561 Pain in right knee: Secondary | ICD-10-CM

## 2020-02-04 MED ORDER — MELOXICAM 7.5 MG PO TABS: 7.5000 mg | ORAL_TABLET | Freq: Every day | ORAL | 0 refills | Status: DC

## 2020-02-04 MED ORDER — PANTOPRAZOLE SODIUM 40 MG PO TBEC
40.0000 mg | DELAYED_RELEASE_TABLET | Freq: Every day | ORAL | 0 refills | Status: DC
Start: 2020-02-04 — End: 2020-02-25
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
## 2020-02-04 NOTE — Patient Instructions (Signed)

## 2020-02-12 ENCOUNTER — Ambulatory Visit: Payer: Self-pay | Admitting: Pharmacy Technician

## 2020-02-12 ENCOUNTER — Other Ambulatory Visit: Payer: Self-pay

## 2020-02-12 DIAGNOSIS — Z79899 Other long term (current) drug therapy: Secondary | ICD-10-CM

## 2020-02-12 NOTE — Progress Notes (Signed)
Completed [facility] application and contract.  Patient agreed to all terms of the [facility] contract.    Patient approved to receive medication assistance at [facility] until time for re-certification in 2022, and as long as eligibility criteria continues to be met.    Provided patient with community resource material based on his particular needs.    Corey Moss J. Corey Moss Care Manager [facility]  

## 2020-02-17 ENCOUNTER — Other Ambulatory Visit: Payer: Self-pay

## 2020-02-17 DIAGNOSIS — Z7689 Persons encountering health services in other specified circumstances: Secondary | ICD-10-CM

## 2020-02-18 LAB — HEMOGLOBIN A1C
Est. average glucose Bld gHb Est-mCnc: 120 mg/dL
Hgb A1c MFr Bld: 5.8 % — ABNORMAL HIGH (ref 4.8–5.6)

## 2020-02-18 LAB — URINALYSIS
Bilirubin, UA: NEGATIVE
Glucose, UA: NEGATIVE
Leukocytes,UA: NEGATIVE
Nitrite, UA: NEGATIVE
Protein,UA: NEGATIVE
RBC, UA: NEGATIVE
Specific Gravity, UA: >=1.03 — AB (ref 1.005–1.030)
Urobilinogen, Ur: 1 mg/dL (ref 0.2–1.0)
pH, UA: 5.5 (ref 5.0–7.5)

## 2020-02-18 LAB — CBC WITH DIFFERENTIAL/PLATELET
Basophils Absolute: 0.1 10*3/uL (ref 0.0–0.2)
Basos: 1 %
EOS (ABSOLUTE): 0.4 10*3/uL (ref 0.0–0.4)
Eos: 5 %
Hematocrit: 46.7 % (ref 37.5–51.0)
Hemoglobin: 15.5 g/dL (ref 13.0–17.7)
Immature Grans (Abs): 0.1 10*3/uL (ref 0.0–0.1)
Immature Granulocytes: 1 %
Lymphocytes Absolute: 2.7 10*3/uL (ref 0.7–3.1)
Lymphs: 29 %
MCH: 29.9 pg (ref 26.6–33.0)
MCHC: 33.2 g/dL (ref 31.5–35.7)
MCV: 90 fL (ref 79–97)
Monocytes Absolute: 0.7 10*3/uL (ref 0.1–0.9)
Monocytes: 7 %
Neutrophils Absolute: 5.2 10*3/uL (ref 1.4–7.0)
Neutrophils: 57 %
Platelets: 296 10*3/uL (ref 150–450)
RBC: 5.19 x10E6/uL (ref 4.14–5.80)
RDW: 13.4 % (ref 11.6–15.4)
WBC: 9.2 10*3/uL (ref 3.4–10.8)

## 2020-02-18 LAB — LIPID PANEL
Chol/HDL Ratio: 5.8 ratio — ABNORMAL HIGH (ref 0.0–5.0)
Cholesterol, Total: 181 mg/dL (ref 100–199)
HDL: 31 mg/dL — ABNORMAL LOW (ref >39)
LDL Chol Calc (NIH): 98 mg/dL (ref 0–99)
Triglycerides: 309 mg/dL — ABNORMAL HIGH (ref 0–149)
VLDL Cholesterol Cal: 52 mg/dL — ABNORMAL HIGH (ref 5–40)

## 2020-02-18 LAB — COMPREHENSIVE METABOLIC PANEL
ALT: 19 IU/L (ref 0–44)
AST: 15 IU/L (ref 0–40)
Albumin/Globulin Ratio: 1.6 (ref 1.2–2.2)
Albumin: 4.2 g/dL (ref 4.0–5.0)
Alkaline Phosphatase: 92 IU/L (ref 48–121)
BUN/Creatinine Ratio: 14 (ref 9–20)
BUN: 13 mg/dL (ref 6–24)
Bilirubin Total: <0.2 mg/dL (ref 0.0–1.2)
CO2: 22 mmol/L (ref 20–29)
Calcium: 9.5 mg/dL (ref 8.7–10.2)
Chloride: 103 mmol/L (ref 96–106)
Creatinine, Ser: 0.94 mg/dL (ref 0.76–1.27)
GFR calc Af Amer: 112 mL/min/{1.73_m2} (ref >59)
GFR calc non Af Amer: 97 mL/min/{1.73_m2} (ref >59)
Globulin, Total: 2.6 g/dL (ref 1.5–4.5)
Glucose: 82 mg/dL (ref 65–99)
Potassium: 4.3 mmol/L (ref 3.5–5.2)
Sodium: 142 mmol/L (ref 134–144)
Total Protein: 6.8 g/dL (ref 6.0–8.5)

## 2020-02-23 ENCOUNTER — Ambulatory Visit: Payer: Self-pay | Admitting: Specialist

## 2020-02-25 ENCOUNTER — Ambulatory Visit: Payer: Self-pay | Admitting: Gerontology

## 2020-02-25 ENCOUNTER — Other Ambulatory Visit: Payer: Self-pay

## 2020-02-25 VITALS — BP 119/71 | HR 78 | Temp 97.9°F | Resp 18 | Wt 227.1 lb

## 2020-02-25 DIAGNOSIS — R7303 Prediabetes: Secondary | ICD-10-CM | POA: Insufficient documentation

## 2020-02-25 DIAGNOSIS — K219 Gastro-esophageal reflux disease without esophagitis: Secondary | ICD-10-CM

## 2020-02-25 DIAGNOSIS — G8929 Other chronic pain: Secondary | ICD-10-CM

## 2020-02-25 DIAGNOSIS — F17200 Nicotine dependence, unspecified, uncomplicated: Secondary | ICD-10-CM

## 2020-02-25 MED ORDER — PANTOPRAZOLE SODIUM 40 MG PO TBEC
40.0000 mg | DELAYED_RELEASE_TABLET | Freq: Every day | ORAL | 1 refills | Status: DC
Start: 2020-02-25 — End: 2020-05-10

## 2020-02-25 MED ORDER — MELOXICAM 7.5 MG PO TABS: 7.5000 mg | ORAL_TABLET | Freq: Every day | ORAL | 0 refills | Status: DC
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
## 2020-02-25 NOTE — Patient Instructions (Signed)

## 2020-03-01 ENCOUNTER — Telehealth: Payer: Self-pay | Admitting: General Practice

## 2020-03-01 NOTE — Telephone Encounter (Addendum)
No vmb set up  ----- Message from [place] Nurse sent at 02/25/2020  2:23 PM EDT ----- Please make pt an appointment with Dr. Justice Rocher once his schedule is available.   Thank you!

## 2020-04-13 ENCOUNTER — Other Ambulatory Visit: Payer: Self-pay | Admitting: Gerontology

## 2020-04-13 DIAGNOSIS — G8929 Other chronic pain: Secondary | ICD-10-CM

## 2020-04-28 ENCOUNTER — Other Ambulatory Visit: Payer: Self-pay

## 2020-04-28 ENCOUNTER — Ambulatory Visit: Payer: Self-pay | Admitting: Gerontology

## 2020-04-28 VITALS — BP 115/71 | HR 76 | Temp 98.1°F | Resp 16 | Wt 231.1 lb

## 2020-04-28 DIAGNOSIS — M79604 Pain in right leg: Secondary | ICD-10-CM | POA: Insufficient documentation

## 2020-04-28 MED ORDER — MELOXICAM 15 MG PO TABS: 15.0000 mg | ORAL_TABLET | Freq: Every day | ORAL | 0 refills | Status: DC
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
## 2020-05-10 ENCOUNTER — Other Ambulatory Visit: Payer: Self-pay | Admitting: Gerontology

## 2020-05-10 DIAGNOSIS — K219 Gastro-esophageal reflux disease without esophagitis: Secondary | ICD-10-CM

## 2020-06-02 ENCOUNTER — Ambulatory Visit: Payer: Self-pay

## 2020-06-02 ENCOUNTER — Other Ambulatory Visit: Payer: Self-pay

## 2020-06-02 ENCOUNTER — Telehealth: Payer: Self-pay

## 2020-06-02 ENCOUNTER — Telehealth: Payer: Self-pay | Admitting: Pharmacy Technician

## 2020-06-02 DIAGNOSIS — Z79899 Other long term (current) drug therapy: Secondary | ICD-10-CM

## 2020-06-02 NOTE — Telephone Encounter (Signed)
[facility] Referral made.  Corey Moss Care Manager Medication Management Clinic

## 2020-06-02 NOTE — Telephone Encounter (Signed)
Patient now living at the [facility] in [place].  Patient inquiring about getting established with a primary care provider in [place].  Contacted [employer] [facility] with patient on phone line.  First available appointment at [facility] is 08/17/20.  Patient stated that he needed refills on medications; however, no refills left.  [facility].  [facility] to [facility]ills for medications.  This will provide medication assistance until patient can be seen at [facility].  Also, provided patient with 2-1-1 to contact for other needs and sending referral to [facility] for food assistance.  Sherilyn Dacosta Care Manager Medication Management Clinic
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
## 2020-06-02 NOTE — Telephone Encounter (Signed)
Social work Tax inspector called patient to see if he was interested in being screened for the [employer] but he was not interested.

## 2020-06-03 ENCOUNTER — Other Ambulatory Visit: Payer: Self-pay | Admitting: Gerontology

## 2020-06-03 DIAGNOSIS — M79604 Pain in right leg: Secondary | ICD-10-CM

## 2020-06-07 ENCOUNTER — Ambulatory Visit: Payer: Self-pay | Admitting: Gerontology

## 2020-06-07 ENCOUNTER — Ambulatory Visit: Payer: Self-pay | Admitting: Specialist

## 2020-06-07 ENCOUNTER — Other Ambulatory Visit: Payer: Self-pay | Admitting: Gerontology

## 2020-06-07 DIAGNOSIS — K219 Gastro-esophageal reflux disease without esophagitis: Secondary | ICD-10-CM

## 2020-06-07 MED ORDER — PANTOPRAZOLE SODIUM 40 MG PO TBEC
40.0000 mg | DELAYED_RELEASE_TABLET | Freq: Every day | ORAL | 0 refills | Status: AC
Start: 2020-06-07 — End: ?

## 2020-06-30 ENCOUNTER — Other Ambulatory Visit: Payer: Self-pay | Admitting: Gerontology

## 2020-06-30 DIAGNOSIS — M79604 Pain in right leg: Secondary | ICD-10-CM

## 2020-07-22 ENCOUNTER — Telehealth: Payer: Self-pay | Admitting: Pharmacy Technician

## 2020-07-22 NOTE — Telephone Encounter (Signed)
Pt no longer a resident at [facility].  Patient failed to provide updated proof of income based on current living situation.  Pt made inactive.  Medication assistance will resume once patient provides updated proof of income.  Unaware of patient's current address.  Mailbox is full unable to leave voice mail message.   Corey Moss Corey Moss Care Manager Medication Management Clinic 

## 2020-08-17 ENCOUNTER — Other Ambulatory Visit: Payer: Self-pay | Admitting: Family Medicine

## 2020-08-17 ENCOUNTER — Ambulatory Visit: Payer: Self-pay | Attending: Family Medicine | Admitting: Family Medicine

## 2020-08-17 ENCOUNTER — Encounter: Payer: Self-pay | Admitting: Family Medicine

## 2020-08-17 ENCOUNTER — Other Ambulatory Visit: Payer: Self-pay

## 2020-08-17 VITALS — BP 133/79 | HR 71 | Ht 69.0 in | Wt 220.0 lb

## 2020-08-17 DIAGNOSIS — M79604 Pain in right leg: Secondary | ICD-10-CM

## 2020-08-17 DIAGNOSIS — H538 Other visual disturbances: Secondary | ICD-10-CM

## 2020-08-17 DIAGNOSIS — F330 Major depressive disorder, recurrent, mild: Secondary | ICD-10-CM

## 2020-08-17 DIAGNOSIS — Z131 Encounter for screening for diabetes mellitus: Secondary | ICD-10-CM

## 2020-08-17 DIAGNOSIS — R5383 Other fatigue: Secondary | ICD-10-CM

## 2020-08-17 LAB — POCT GLYCOSYLATED HEMOGLOBIN (HGB A1C): Hemoglobin A1C: 5.5 % (ref 4.0–5.6)

## 2020-08-17 MED ORDER — MELOXICAM 7.5 MG PO TABS: 7.5000 mg | ORAL_TABLET | Freq: Every day | ORAL | 3 refills | Status: DC

## 2020-08-17 MED ORDER — DULOXETINE HCL 60 MG PO CPEP: 60.0000 mg | ORAL_CAPSULE | Freq: Every day | ORAL | 3 refills | Status: DC

## 2020-08-17 MED FILL — DULoxetine HCL 60 MG CPEP: 60 | 30 days supply | Qty: 30 | Fill #0

## 2020-08-17 MED FILL — MELOXICAM 7.5 MG TABLET: 7.5 | 30 days supply | Qty: 30 | Fill #0
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
## 2020-08-17 NOTE — Progress Notes (Signed)
Having pain in right leg. Having trouble with depression. Has dizziness and double vision at times.

## 2020-08-18 ENCOUNTER — Other Ambulatory Visit: Payer: Self-pay | Admitting: Family Medicine

## 2020-08-18 ENCOUNTER — Telehealth: Payer: Self-pay

## 2020-08-18 LAB — CBC WITH DIFFERENTIAL/PLATELET
Basophils Absolute: 0.1 10*3/uL (ref 0.0–0.2)
Basos: 1 %
EOS (ABSOLUTE): 0.4 10*3/uL (ref 0.0–0.4)
Eos: 4 %
Hematocrit: 48.6 % (ref 37.5–51.0)
Hemoglobin: 16.4 g/dL (ref 13.0–17.7)
Immature Grans (Abs): 0 10*3/uL (ref 0.0–0.1)
Immature Granulocytes: 0 %
Lymphocytes Absolute: 2.8 10*3/uL (ref 0.7–3.1)
Lymphs: 28 %
MCH: 29.6 pg (ref 26.6–33.0)
MCHC: 33.7 g/dL (ref 31.5–35.7)
MCV: 88 fL (ref 79–97)
Monocytes Absolute: 0.9 10*3/uL (ref 0.1–0.9)
Monocytes: 8 %
Neutrophils Absolute: 5.9 10*3/uL (ref 1.4–7.0)
Neutrophils: 59 %
Platelets: 302 10*3/uL (ref 150–450)
RBC: 5.54 x10E6/uL (ref 4.14–5.80)
RDW: 13 % (ref 11.6–15.4)
WBC: 10.1 10*3/uL (ref 3.4–10.8)

## 2020-08-18 LAB — BASIC METABOLIC PANEL
BUN/Creatinine Ratio: 10 (ref 9–20)
BUN: 10 mg/dL (ref 6–24)
CO2: 24 mmol/L (ref 20–29)
Calcium: 9.3 mg/dL (ref 8.7–10.2)
Chloride: 104 mmol/L (ref 96–106)
Creatinine, Ser: 1 mg/dL (ref 0.76–1.27)
GFR calc Af Amer: 103 mL/min/{1.73_m2} (ref >59)
GFR calc non Af Amer: 89 mL/min/{1.73_m2} (ref >59)
Glucose: 85 mg/dL (ref 65–99)
Potassium: 4.8 mmol/L (ref 3.5–5.2)
Sodium: 144 mmol/L (ref 134–144)

## 2020-08-18 LAB — TSH: TSH: 0.34 u[IU]/mL — ABNORMAL LOW (ref 0.450–4.500)

## 2020-08-18 LAB — VITAMIN D 25 HYDROXY (VIT D DEFICIENCY, FRACTURES): Vit D, 25-Hydroxy: 20.2 ng/mL — ABNORMAL LOW (ref 30.0–100.0)

## 2020-08-18 LAB — T4, FREE: Free T4: 1.53 ng/dL (ref 0.82–1.77)

## 2020-08-18 MED ORDER — ERGOCALCIFEROL 1.25 MG (50000 UT) PO CAPS: 50000.0000 [IU] | ORAL_CAPSULE | ORAL | 0 refills | Status: AC

## 2020-08-18 MED FILL — VIT D2 1.25 MG (50,000 UNIT: 1.25 MG | 28 days supply | Qty: 4 | Fill #0

## 2020-08-18 NOTE — Telephone Encounter (Signed)
-----   Message from Hoy Register, MD sent at 08/18/2020 12:25 PM EST ----- Please inform him that his vitamin D is low which could explain his fatigue and I have sent a prescription for replacement to his pharmacy thyroid level is also abnormal and this will need to be repeated at his next visit prior to deciding on treatment.

## 2020-08-18 NOTE — Telephone Encounter (Signed)
Pt was called and a VM was left informing pt of lab results. 

## 2020-09-23 MED FILL — MELOXICAM 7.5 MG TABLET: 7.5 | 30 days supply | Qty: 30 | Fill #1

## 2020-09-23 MED FILL — DULoxetine HCL 60 MG CPEP: 60 | 30 days supply | Qty: 30 | Fill #1

## 2020-09-23 MED FILL — PANTOPRAZOLE SOD DR 40 MG T: 40 | 30 days supply | Qty: 30 | Fill #0

## 2020-11-16 ENCOUNTER — Ambulatory Visit: Payer: Self-pay | Admitting: Family Medicine

## 2021-01-19 ENCOUNTER — Encounter (HOSPITAL_COMMUNITY): Payer: Self-pay

## 2021-01-19 ENCOUNTER — Emergency Department (HOSPITAL_COMMUNITY)
Admission: EM | Admit: 2021-01-19 | Discharge: 2021-01-19 | Disposition: A | Discharge status: Home / Self Care | Payer: Self-pay | Attending: Emergency Medicine | Admitting: Emergency Medicine

## 2021-01-19 ENCOUNTER — Other Ambulatory Visit: Payer: Self-pay

## 2021-01-19 DIAGNOSIS — Z711 Person with feared health complaint in whom no diagnosis is made: Secondary | ICD-10-CM

## 2021-01-19 DIAGNOSIS — Z28310 Unvaccinated for covid-19: Secondary | ICD-10-CM | POA: Insufficient documentation

## 2021-01-19 DIAGNOSIS — U071 COVID-19: Secondary | ICD-10-CM | POA: Insufficient documentation

## 2021-01-19 DIAGNOSIS — F17210 Nicotine dependence, cigarettes, uncomplicated: Secondary | ICD-10-CM | POA: Insufficient documentation

## 2021-01-19 LAB — CBC WITH DIFFERENTIAL/PLATELET
Abs Immature Granulocytes: 0.02 10*3/uL (ref 0.00–0.07)
Basophils Absolute: 0.1 10*3/uL (ref 0.0–0.1)
Basophils Relative: 1 %
Eosinophils Absolute: 0.2 10*3/uL (ref 0.0–0.5)
Eosinophils Relative: 3 %
HCT: 53.3 % — ABNORMAL HIGH (ref 39.0–52.0)
Hemoglobin: 17.9 g/dL — ABNORMAL HIGH (ref 13.0–17.0)
Immature Granulocytes: 0 %
Lymphocytes Relative: 35 %
Lymphs Abs: 2.8 10*3/uL (ref 0.7–4.0)
MCH: 30.4 pg (ref 26.0–34.0)
MCHC: 33.6 g/dL (ref 30.0–36.0)
MCV: 90.6 fL (ref 80.0–100.0)
Monocytes Absolute: 0.5 10*3/uL (ref 0.1–1.0)
Monocytes Relative: 6 %
Neutro Abs: 4.5 10*3/uL (ref 1.7–7.7)
Neutrophils Relative %: 55 %
Platelets: 271 10*3/uL (ref 150–400)
RBC: 5.88 MIL/uL — ABNORMAL HIGH (ref 4.22–5.81)
RDW: 13.5 % (ref 11.5–15.5)
WBC: 8.1 10*3/uL (ref 4.0–10.5)
nRBC: 0 % (ref 0.0–0.2)

## 2021-01-19 LAB — SARS CORONAVIRUS 2 (TAT 6-24 HRS): SARS Coronavirus 2: POSITIVE — AB

## 2021-01-19 NOTE — Discharge Instructions (Addendum)
Your elevated hematocrit level is likely due to long-term tobacco use.  You may check your labs through MyChart, link below.  A COVID test was also obtained today, the result can also be checked through MyChart.  Use number provided to find a primary care provider.

## 2021-01-19 NOTE — ED Triage Notes (Signed)
[facility] for recheck [facility]. Reports HTC elevated.  Patient also requesting Covid-19 test.   Denies all sx.    A/ox4 Ambulatory in triage.
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
## 2021-03-03 ENCOUNTER — Other Ambulatory Visit: Payer: Self-pay

## 2021-03-03 ENCOUNTER — Ambulatory Visit (HOSPITAL_COMMUNITY)
Admission: EM | Admit: 2021-03-03 | Discharge: 2021-03-03 | Disposition: A | Discharge status: Home / Self Care | Payer: No Payment, Other | Attending: Psychiatry | Admitting: Psychiatry

## 2021-03-03 DIAGNOSIS — F32A Depression, unspecified: Secondary | ICD-10-CM | POA: Insufficient documentation

## 2021-03-03 DIAGNOSIS — F1994 Other psychoactive substance use, unspecified with psychoactive substance-induced mood disorder: Secondary | ICD-10-CM | POA: Insufficient documentation

## 2021-03-03 DIAGNOSIS — Z5900 Homelessness unspecified: Secondary | ICD-10-CM | POA: Insufficient documentation

## 2021-03-03 DIAGNOSIS — F419 Anxiety disorder, unspecified: Secondary | ICD-10-CM | POA: Insufficient documentation

## 2021-03-03 MED FILL — Duloxetine HCl Enteric Coated Pellets Cap 60 MG (Base Eq): ORAL | 30 days supply | Qty: 30 | Fill #0 | Status: AC
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
## 2021-03-03 NOTE — Discharge Instructions (Addendum)
Please go to:  [facility] [address] [place], [place], [place] [address]   To pick up your Cymbalta medication
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
## 2021-03-03 NOTE — ED Notes (Signed)
Pt discharged in no acute distress. Verbalized understanding of discharge instructions reviewed on AVS to include resources for follow up care. Safety maintained.

## 2021-03-21 ENCOUNTER — Other Ambulatory Visit: Payer: Self-pay

## 2021-03-29 ENCOUNTER — Ambulatory Visit: Payer: Self-pay

## 2021-05-25 ENCOUNTER — Other Ambulatory Visit: Payer: Self-pay

## 2021-05-25 MED ORDER — RISPERIDONE 1 MG PO TABS
ORAL_TABLET | ORAL | 1 refills | Status: DC
  Filled 2021-05-26: qty 30, 30d supply, fill #0
  Filled 2021-06-20: qty 30, 30d supply, fill #1

## 2021-05-25 MED ORDER — FLUOXETINE HCL 10 MG PO CAPS
ORAL_CAPSULE | ORAL | 1 refills | Status: DC
  Filled 2021-05-26: qty 30, 30d supply, fill #0
  Filled 2021-06-20: qty 30, 30d supply, fill #1

## 2021-05-26 ENCOUNTER — Other Ambulatory Visit: Payer: Self-pay

## 2021-05-31 ENCOUNTER — Ambulatory Visit: Payer: Self-pay | Admitting: Pharmacy Technician

## 2021-05-31 ENCOUNTER — Other Ambulatory Visit: Payer: Self-pay

## 2021-05-31 DIAGNOSIS — Z79899 Other long term (current) drug therapy: Secondary | ICD-10-CM

## 2021-05-31 NOTE — Progress Notes (Signed)
Completed [facility] application and contract.  Patient agreed to all terms of the [facility] contract.    Patient approved to receive medication assistance at [facility] until time for re-certification in 2023, and as long as eligibility criteria continues to be met.    Provided patient with community resource material based on his particular needs.    Makenzi Bannister J. Kenrick Pore Care Manager [facility]  

## 2021-06-20 ENCOUNTER — Other Ambulatory Visit: Payer: Self-pay

## 2021-07-18 ENCOUNTER — Other Ambulatory Visit: Payer: Self-pay

## 2021-07-24 ENCOUNTER — Other Ambulatory Visit: Payer: Self-pay

## 2021-07-24 MED ORDER — FLUOXETINE HCL 10 MG PO CAPS
ORAL_CAPSULE | ORAL | 0 refills | Status: DC
  Filled 2021-07-24: qty 30, 30d supply, fill #0

## 2021-07-25 ENCOUNTER — Other Ambulatory Visit: Payer: Self-pay

## 2021-08-17 ENCOUNTER — Other Ambulatory Visit: Payer: Self-pay

## 2021-08-17 MED ORDER — CLONIDINE HCL 0.1 MG PO TABS
0.1000 mg | ORAL_TABLET | Freq: Every day | ORAL | 1 refills | Status: DC
  Filled 2021-08-17: qty 60, 30d supply, fill #0
  Filled 2021-10-02: qty 60, 30d supply, fill #1

## 2021-08-17 MED ORDER — FLUOXETINE HCL 10 MG PO CAPS
10.0000 mg | ORAL_CAPSULE | Freq: Every day | ORAL | 1 refills | Status: DC
  Filled 2021-08-17: qty 30, 30d supply, fill #0
  Filled 2021-09-26: qty 30, 30d supply, fill #1

## 2021-08-22 ENCOUNTER — Other Ambulatory Visit: Payer: Self-pay

## 2021-08-30 ENCOUNTER — Other Ambulatory Visit: Payer: Self-pay

## 2021-09-26 ENCOUNTER — Other Ambulatory Visit: Payer: Self-pay

## 2021-10-02 ENCOUNTER — Other Ambulatory Visit: Payer: Self-pay

## 2021-10-03 ENCOUNTER — Other Ambulatory Visit: Payer: Self-pay

## 2021-10-30 ENCOUNTER — Other Ambulatory Visit: Payer: Self-pay

## 2021-11-03 ENCOUNTER — Other Ambulatory Visit: Payer: Self-pay

## 2021-11-03 MED ORDER — FLUOXETINE HCL 10 MG PO CAPS
10.0000 mg | ORAL_CAPSULE | Freq: Every day | ORAL | 0 refills | Status: DC
  Filled 2021-11-03: qty 30, 30d supply, fill #0

## 2021-11-03 MED ORDER — CLONIDINE HCL 0.1 MG PO TABS
0.1000 mg | ORAL_TABLET | Freq: Every day | ORAL | 0 refills | Status: AC
  Filled 2021-11-03: qty 60, 30d supply, fill #0

## 2021-11-06 ENCOUNTER — Other Ambulatory Visit: Payer: Self-pay

## 2021-11-10 ENCOUNTER — Other Ambulatory Visit: Payer: Self-pay

## 2021-11-10 MED ORDER — OXCARBAZEPINE 300 MG PO TABS
600.0000 mg | ORAL_TABLET | Freq: Every evening | ORAL | 1 refills | Status: DC
  Filled 2021-11-10 – 2021-12-20 (×2): qty 60, 30d supply, fill #0

## 2021-11-10 MED ORDER — FLUOXETINE HCL 10 MG PO CAPS
10.0000 mg | ORAL_CAPSULE | Freq: Every day | ORAL | 1 refills | Status: DC
  Filled 2021-12-20: qty 30, 30d supply, fill #0

## 2021-11-16 ENCOUNTER — Other Ambulatory Visit: Payer: Self-pay

## 2021-12-20 ENCOUNTER — Other Ambulatory Visit: Payer: Self-pay

## 2021-12-20 MED ORDER — FLUOXETINE HCL 10 MG PO CAPS: 10.0000 mg | ORAL_CAPSULE | Freq: Every day | ORAL | 0 refills | Status: DC

## 2021-12-20 MED ORDER — OXCARBAZEPINE 300 MG PO TABS: ORAL_TABLET | ORAL | 1 refills | Status: DC

## 2021-12-21 ENCOUNTER — Other Ambulatory Visit: Payer: Self-pay

## 2021-12-22 ENCOUNTER — Other Ambulatory Visit: Payer: Self-pay

## 2021-12-26 ENCOUNTER — Other Ambulatory Visit: Payer: Self-pay

## 2021-12-29 ENCOUNTER — Other Ambulatory Visit: Payer: Self-pay

## 2021-12-29 MED ORDER — OXCARBAZEPINE 300 MG PO TABS
ORAL_TABLET | ORAL | 1 refills | Status: DC
  Filled 2021-12-29 – 2022-01-22 (×2): qty 90, 30d supply, fill #0
  Filled 2022-02-26: qty 90, 30d supply, fill #1

## 2021-12-29 MED ORDER — FLUOXETINE HCL 10 MG PO CAPS
10.0000 mg | ORAL_CAPSULE | Freq: Every day | ORAL | 1 refills | Status: DC
  Filled 2021-12-29 – 2022-01-22 (×2): qty 30, 30d supply, fill #0
  Filled 2022-02-26: qty 30, 30d supply, fill #1

## 2022-01-12 ENCOUNTER — Other Ambulatory Visit: Payer: Self-pay

## 2022-01-22 ENCOUNTER — Other Ambulatory Visit: Payer: Self-pay

## 2022-01-26 ENCOUNTER — Other Ambulatory Visit: Payer: Self-pay

## 2022-02-26 ENCOUNTER — Other Ambulatory Visit: Payer: Self-pay

## 2022-03-01 ENCOUNTER — Other Ambulatory Visit: Payer: Self-pay

## 2022-03-01 MED ORDER — OXCARBAZEPINE 300 MG PO TABS
ORAL_TABLET | ORAL | 1 refills | Status: AC
  Filled 2022-03-01: qty 71, 71d supply, fill #0
  Filled 2022-03-01: qty 19, 19d supply, fill #0

## 2022-03-01 MED ORDER — ARIPIPRAZOLE 10 MG PO TABS
ORAL_TABLET | ORAL | 1 refills | Status: DC
  Filled 2022-03-01: qty 30, 30d supply, fill #0

## 2022-03-01 MED ORDER — ARIPIPRAZOLE 10 MG PO TABS: ORAL_TABLET | ORAL | 1 refills | Status: AC

## 2022-03-02 ENCOUNTER — Other Ambulatory Visit: Payer: Self-pay

## 2022-03-29 ENCOUNTER — Other Ambulatory Visit: Payer: Self-pay

## 2022-03-29 MED ORDER — RISPERIDONE 1 MG PO TABS
1.0000 mg | ORAL_TABLET | Freq: Every day | ORAL | 0 refills | Status: AC
  Filled 2022-03-29: qty 30, 30d supply, fill #0

## 2022-03-30 ENCOUNTER — Other Ambulatory Visit: Payer: Self-pay

## 2022-07-23 ENCOUNTER — Emergency Department: Payer: Medicaid Other

## 2022-07-23 ENCOUNTER — Emergency Department
Admission: EM | Admit: 2022-07-23 | Discharge: 2022-07-23 | Disposition: A | Discharge status: Home / Self Care | Payer: Medicaid Other | Attending: Emergency Medicine | Admitting: Emergency Medicine

## 2022-07-23 ENCOUNTER — Other Ambulatory Visit: Payer: Self-pay

## 2022-07-23 DIAGNOSIS — S2242XA Multiple fractures of ribs, left side, initial encounter for closed fracture: Secondary | ICD-10-CM | POA: Diagnosis not present

## 2022-07-23 DIAGNOSIS — R519 Headache, unspecified: Secondary | ICD-10-CM | POA: Diagnosis present

## 2022-07-23 DIAGNOSIS — S065X0A Traumatic subdural hemorrhage without loss of consciousness, initial encounter: Secondary | ICD-10-CM | POA: Insufficient documentation

## 2022-07-23 DIAGNOSIS — S065XAA Traumatic subdural hemorrhage with loss of consciousness status unknown, initial encounter: Secondary | ICD-10-CM

## 2022-07-23 DIAGNOSIS — Z23 Encounter for immunization: Secondary | ICD-10-CM | POA: Insufficient documentation

## 2022-07-23 LAB — CBC WITH DIFFERENTIAL/PLATELET
Abs Immature Granulocytes: 0.06 10*3/uL (ref 0.00–0.07)
Basophils Absolute: 0.1 10*3/uL (ref 0.0–0.1)
Basophils Relative: 1 %
Eosinophils Absolute: 0.1 10*3/uL (ref 0.0–0.5)
Eosinophils Relative: 1 %
HCT: 46.9 % (ref 39.0–52.0)
Hemoglobin: 15.7 g/dL (ref 13.0–17.0)
Immature Granulocytes: 1 %
Lymphocytes Relative: 12 %
Lymphs Abs: 1.5 10*3/uL (ref 0.7–4.0)
MCH: 30.4 pg (ref 26.0–34.0)
MCHC: 33.5 g/dL (ref 30.0–36.0)
MCV: 90.9 fL (ref 80.0–100.0)
Monocytes Absolute: 1 10*3/uL (ref 0.1–1.0)
Monocytes Relative: 8 %
Neutro Abs: 10 10*3/uL — ABNORMAL HIGH (ref 1.7–7.7)
Neutrophils Relative %: 77 %
Platelets: 264 10*3/uL (ref 150–400)
RBC: 5.16 MIL/uL (ref 4.22–5.81)
RDW: 13.1 % (ref 11.5–15.5)
WBC: 12.7 10*3/uL — ABNORMAL HIGH (ref 4.0–10.5)
nRBC: 0 % (ref 0.0–0.2)

## 2022-07-23 LAB — BASIC METABOLIC PANEL
Anion gap: 8 (ref 5–15)
BUN: 12 mg/dL (ref 6–20)
CO2: 26 mmol/L (ref 22–32)
Calcium: 8.9 mg/dL (ref 8.9–10.3)
Chloride: 106 mmol/L (ref 98–111)
Creatinine, Ser: 0.79 mg/dL (ref 0.61–1.24)
GFR, Estimated: >60 mL/min (ref >60)
Glucose, Bld: 107 mg/dL — ABNORMAL HIGH (ref 70–99)
Potassium: 4.2 mmol/L (ref 3.5–5.1)
Sodium: 140 mmol/L (ref 135–145)

## 2022-07-23 MED ORDER — MORPHINE SULFATE (PF) 4 MG/ML IV SOLN
4.0000 mg | Freq: Once | INTRAVENOUS | Status: AC
  Administered 2022-07-23: 4 mg via INTRAVENOUS
  Filled 2022-07-23: qty 1

## 2022-07-23 MED ORDER — HYDROCODONE-ACETAMINOPHEN 5-325 MG PO TABS: 1.0000 | ORAL_TABLET | Freq: Once | ORAL | Status: DC

## 2022-07-23 MED ORDER — NAPROXEN 500 MG PO TABS
500.0000 mg | ORAL_TABLET | Freq: Once | ORAL | Status: AC
  Administered 2022-07-23: 500 mg via ORAL
  Filled 2022-07-23: qty 1

## 2022-07-23 MED ORDER — OXYCODONE-ACETAMINOPHEN 5-325 MG PO TABS
1.0000 | ORAL_TABLET | Freq: Once | ORAL | Status: AC
  Administered 2022-07-23: 1 via ORAL
  Filled 2022-07-23: qty 1

## 2022-07-23 MED ORDER — OXYCODONE-ACETAMINOPHEN 5-325 MG PO TABS: 1.0000 | ORAL_TABLET | ORAL | 0 refills | Status: DC | PRN

## 2022-07-23 MED ORDER — TETANUS-DIPHTH-ACELL PERTUSSIS 5-2.5-18.5 LF-MCG/0.5 IM SUSY
0.5000 mL | PREFILLED_SYRINGE | Freq: Once | INTRAMUSCULAR | Status: AC
  Administered 2022-07-23: 0.5 mL via INTRAMUSCULAR
  Filled 2022-07-23: qty 0.5

## 2022-07-23 MED ORDER — IOHEXOL 300 MG/ML  SOLN
100.0000 mL | Freq: Once | INTRAMUSCULAR | Status: AC | PRN
  Administered 2022-07-23: 100 mL via INTRAVENOUS
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
## 2022-07-23 NOTE — ED Notes (Signed)
Report to Henry, RN.
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
## 2022-07-23 NOTE — Discharge Instructions (Signed)
Pain control:  Ibuprofen (motrin/aleve/advil) - You can take 3-4 tablets (600-800 mg) every 6 hours as needed for pain/fever.  Acetaminophen (tylenol) - You can take 2 extra strength tablets (1000 mg) every 6 hours as needed for pain/fever.  You can alternate these medications or take them together.  Make sure you eat food/drink water when taking these medications.  You were given a prescription for narcotic pain medications.  Take only if in severe pain.  These are very addictive medications.  These medications can make you constipated.  If you need to take more than 1-2 doses, start a stool softner.  If you become constipated, take 1 capfull of MiraLAX, can repeat untill having regular bowel movements.  Keep this medication out of reach of any children.  Please go to the following website to schedule new (and existing) patient appointments:   [email or web address]   The following is a list of primary care offices in the area who are accepting new patients at this time.  Please reach out to one of them directly and let them know you would like to schedule an appointment to follow up on an Emergency Department visit, and/or to establish a new primary care provider (PCP).  There are likely other primary care clinics in the are who are accepting new patients, but this is an excellent place to start:  [place] physician: Dr Lavon Paganini [address] [place], [place] [address] [phone]  [facility] Lead Physician: Dr Steele Sizer [address], [place], [place] [address] [phone]  [place] Physician: Dr Park Liter [address] [place], [place] [address] [phone]  [facility] Lead Physician: Dr Dewaine Oats [address], [place], [place] [address] [phone]  [facility] [place] Physician: Dr Halina Maidens [address] [place], [place], [place] [address] [phone]

## 2022-07-23 NOTE — ED Notes (Signed)
Teaching and return demonstration given of Incentive Spirometer. Patient states he understands purpose and will perform task hourly.

## 2022-07-23 NOTE — ED Notes (Signed)
Patient advised of s/s of neurological changes and advised to return if any change
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
## 2022-07-26 ENCOUNTER — Other Ambulatory Visit: Payer: Self-pay

## 2022-07-26 ENCOUNTER — Emergency Department: Payer: Medicaid Other

## 2022-07-26 ENCOUNTER — Emergency Department
Admission: EM | Admit: 2022-07-26 | Discharge: 2022-07-26 | Disposition: A | Discharge status: Home / Self Care | Payer: Medicaid Other | Attending: Emergency Medicine | Admitting: Emergency Medicine

## 2022-07-26 DIAGNOSIS — S065X0D Traumatic subdural hemorrhage without loss of consciousness, subsequent encounter: Secondary | ICD-10-CM | POA: Insufficient documentation

## 2022-07-26 DIAGNOSIS — S2242XD Multiple fractures of ribs, left side, subsequent encounter for fracture with routine healing: Secondary | ICD-10-CM | POA: Diagnosis not present

## 2022-07-26 DIAGNOSIS — S0990XD Unspecified injury of head, subsequent encounter: Secondary | ICD-10-CM | POA: Diagnosis present

## 2022-07-26 DIAGNOSIS — S065XAA Traumatic subdural hemorrhage with loss of consciousness status unknown, initial encounter: Secondary | ICD-10-CM

## 2022-07-26 LAB — BASIC METABOLIC PANEL
Anion gap: 10 (ref 5–15)
BUN: 8 mg/dL (ref 6–20)
CO2: 23 mmol/L (ref 22–32)
Calcium: 9 mg/dL (ref 8.9–10.3)
Chloride: 108 mmol/L (ref 98–111)
Creatinine, Ser: 0.68 mg/dL (ref 0.61–1.24)
GFR, Estimated: >60 mL/min (ref >60)
Glucose, Bld: 101 mg/dL — ABNORMAL HIGH (ref 70–99)
Potassium: 4.1 mmol/L (ref 3.5–5.1)
Sodium: 141 mmol/L (ref 135–145)

## 2022-07-26 LAB — CBC
HCT: 47.5 % (ref 39.0–52.0)
Hemoglobin: 16.1 g/dL (ref 13.0–17.0)
MCH: 30.4 pg (ref 26.0–34.0)
MCHC: 33.9 g/dL (ref 30.0–36.0)
MCV: 89.6 fL (ref 80.0–100.0)
Platelets: 263 10*3/uL (ref 150–400)
RBC: 5.3 MIL/uL (ref 4.22–5.81)
RDW: 13.1 % (ref 11.5–15.5)
WBC: 11.5 10*3/uL — ABNORMAL HIGH (ref 4.0–10.5)
nRBC: 0 % (ref 0.0–0.2)

## 2022-07-26 MED ORDER — OXYCODONE-ACETAMINOPHEN 10-325 MG PO TABS: 1.0000 | ORAL_TABLET | Freq: Four times a day (QID) | ORAL | 0 refills | Status: AC | PRN

## 2022-07-26 MED ORDER — LIDOCAINE 5 % EX PTCH: 1.0000 | MEDICATED_PATCH | Freq: Two times a day (BID) | CUTANEOUS | 11 refills | Status: AC

## 2022-07-26 MED ORDER — MELOXICAM 15 MG PO TABS: 15.0000 mg | ORAL_TABLET | Freq: Every day | ORAL | 0 refills | Status: AC

## 2022-07-26 MED ORDER — MORPHINE SULFATE (PF) 4 MG/ML IV SOLN
4.0000 mg | Freq: Once | INTRAVENOUS | Status: AC
  Administered 2022-07-26: 4 mg via INTRAVENOUS
  Filled 2022-07-26: qty 1
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
## 2022-07-26 NOTE — Discharge Instructions (Addendum)
-  Your chest x-ray does not show any signs of pneumonia or a collapsed lung.  Your fractures are healing as expected.  You may utilize the oxycodone/acetaminophen, meloxicam, and lidocaine patches as needed to help manage her pain.  It is important that you continue to manage her pain and continue taking deep breaths throughout the day to prevent any complications.    -Your subdural hematoma does not appear to be worsening.  I suspect that may improve over time.  However, I do recommend following up with a primary care provider within the next week or so for reevaluation.  -Return to the emergency department anytime if you begin to experience any new or worsening symptoms.  Please go to the following website to schedule new (and existing) patient appointments:   [email or web address]   The following is a list of primary care offices in the area who are accepting new patients at this time.  Please reach out to one of them directly and let them know you would like to schedule an appointment to follow up on an Emergency Department visit, and/or to establish a new primary care provider (PCP).  There are likely other primary care clinics in the are who are accepting new patients, but this is an excellent place to start:  [place] physician: Dr Lavon Paganini [address] [place], [place] [address] [phone]  [facility] Lead Physician: Dr Steele Sizer [address], [place], [place] [address] [phone]  [place] Physician: Dr Park Liter [address] [place], [place] [address] [phone]  [facility] Lead Physician: Dr Dewaine Oats [address], [place], [place] [address] [phone]  [facility] [place] Physician: Dr Halina Maidens [address] [place], [place], [place] [address] [phone]
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
## 2022-08-09 ENCOUNTER — Other Ambulatory Visit: Payer: Self-pay

## 2022-08-09 MED ORDER — PAROXETINE HCL 10 MG PO TABS
10.0000 mg | ORAL_TABLET | Freq: Every evening | ORAL | 0 refills | Status: DC
  Filled 2022-08-09: qty 30, 30d supply, fill #0

## 2022-09-06 ENCOUNTER — Other Ambulatory Visit: Payer: Self-pay

## 2022-09-06 MED ORDER — PAROXETINE HCL 10 MG PO TABS
10.0000 mg | ORAL_TABLET | Freq: Every day | ORAL | 0 refills | Status: AC
  Filled 2022-09-06: qty 30, 30d supply, fill #0

## 2022-09-07 ENCOUNTER — Other Ambulatory Visit: Payer: Self-pay

## 2022-09-12 ENCOUNTER — Other Ambulatory Visit: Payer: Self-pay

## 2022-10-10 ENCOUNTER — Emergency Department
Admission: EM | Admit: 2022-10-10 | Discharge: 2022-10-11 | Disposition: A | Discharge status: Home / Self Care | Payer: 59 | Attending: Emergency Medicine | Admitting: Emergency Medicine

## 2022-10-10 ENCOUNTER — Other Ambulatory Visit: Payer: Self-pay

## 2022-10-10 DIAGNOSIS — F339 Major depressive disorder, recurrent, unspecified: Secondary | ICD-10-CM

## 2022-10-10 DIAGNOSIS — F17210 Nicotine dependence, cigarettes, uncomplicated: Secondary | ICD-10-CM | POA: Insufficient documentation

## 2022-10-10 DIAGNOSIS — Z1152 Encounter for screening for COVID-19: Secondary | ICD-10-CM | POA: Diagnosis not present

## 2022-10-10 DIAGNOSIS — Y92410 Unspecified street and highway as the place of occurrence of the external cause: Secondary | ICD-10-CM | POA: Insufficient documentation

## 2022-10-10 DIAGNOSIS — R45851 Suicidal ideations: Secondary | ICD-10-CM | POA: Diagnosis not present

## 2022-10-10 DIAGNOSIS — F330 Major depressive disorder, recurrent, mild: Secondary | ICD-10-CM | POA: Diagnosis not present

## 2022-10-10 DIAGNOSIS — F17200 Nicotine dependence, unspecified, uncomplicated: Secondary | ICD-10-CM | POA: Diagnosis present

## 2022-10-10 DIAGNOSIS — F331 Major depressive disorder, recurrent, moderate: Secondary | ICD-10-CM

## 2022-10-10 DIAGNOSIS — Z046 Encounter for general psychiatric examination, requested by authority: Secondary | ICD-10-CM

## 2022-10-10 DIAGNOSIS — R259 Unspecified abnormal involuntary movements: Secondary | ICD-10-CM | POA: Diagnosis not present

## 2022-10-10 DIAGNOSIS — S069XAA Unspecified intracranial injury with loss of consciousness status unknown, initial encounter: Secondary | ICD-10-CM | POA: Diagnosis present

## 2022-10-10 DIAGNOSIS — F1910 Other psychoactive substance abuse, uncomplicated: Secondary | ICD-10-CM

## 2022-10-10 DIAGNOSIS — S069X0A Unspecified intracranial injury without loss of consciousness, initial encounter: Secondary | ICD-10-CM | POA: Insufficient documentation

## 2022-10-10 HISTORY — DX: Other psychoactive substance use, unspecified with psychoactive substance-induced mood disorder: F19.94

## 2022-10-10 HISTORY — DX: Traumatic subdural hemorrhage with loss of consciousness status unknown, initial encounter: S06.5XAA

## 2022-10-10 LAB — CBC WITH DIFFERENTIAL/PLATELET
Abs Immature Granulocytes: 0.02 10*3/uL (ref 0.00–0.07)
Basophils Absolute: 0.1 10*3/uL (ref 0.0–0.1)
Basophils Relative: 1 %
Eosinophils Absolute: 0.6 10*3/uL — ABNORMAL HIGH (ref 0.0–0.5)
Eosinophils Relative: 8 %
HCT: 48.6 % (ref 39.0–52.0)
Hemoglobin: 16.1 g/dL (ref 13.0–17.0)
Immature Granulocytes: 0 %
Lymphocytes Relative: 34 %
Lymphs Abs: 2.6 10*3/uL (ref 0.7–4.0)
MCH: 30.4 pg (ref 26.0–34.0)
MCHC: 33.1 g/dL (ref 30.0–36.0)
MCV: 91.7 fL (ref 80.0–100.0)
Monocytes Absolute: 0.7 10*3/uL (ref 0.1–1.0)
Monocytes Relative: 9 %
Neutro Abs: 3.6 10*3/uL (ref 1.7–7.7)
Neutrophils Relative %: 48 %
Platelets: 307 10*3/uL (ref 150–400)
RBC: 5.3 MIL/uL (ref 4.22–5.81)
RDW: 13.1 % (ref 11.5–15.5)
WBC: 7.5 10*3/uL (ref 4.0–10.5)
nRBC: 0 % (ref 0.0–0.2)

## 2022-10-10 LAB — COMPREHENSIVE METABOLIC PANEL
ALT: 16 U/L (ref 0–44)
AST: 22 U/L (ref 15–41)
Albumin: 4 g/dL (ref 3.5–5.0)
Alkaline Phosphatase: 92 U/L (ref 38–126)
Anion gap: 7 (ref 5–15)
BUN: 12 mg/dL (ref 6–20)
CO2: 29 mmol/L (ref 22–32)
Calcium: 9 mg/dL (ref 8.9–10.3)
Chloride: 102 mmol/L (ref 98–111)
Creatinine, Ser: 0.86 mg/dL (ref 0.61–1.24)
GFR, Estimated: >60 mL/min (ref >60)
Glucose, Bld: 98 mg/dL (ref 70–99)
Potassium: 4.1 mmol/L (ref 3.5–5.1)
Sodium: 138 mmol/L (ref 135–145)
Total Bilirubin: 0.6 mg/dL (ref 0.3–1.2)
Total Protein: 7.6 g/dL (ref 6.5–8.1)

## 2022-10-10 LAB — URINE DRUG SCREEN, QUALITATIVE (ARMC ONLY)
Amphetamines, Ur Screen: NOT DETECTED
Barbiturates, Ur Screen: NOT DETECTED
Benzodiazepine, Ur Scrn: POSITIVE — AB
Cannabinoid 50 Ng, Ur ~~LOC~~: NOT DETECTED
Cocaine Metabolite,Ur ~~LOC~~: POSITIVE — AB
MDMA (Ecstasy)Ur Screen: POSITIVE — AB
Methadone Scn, Ur: NOT DETECTED
Opiate, Ur Screen: NOT DETECTED
Phencyclidine (PCP) Ur S: NOT DETECTED
Tricyclic, Ur Screen: NOT DETECTED

## 2022-10-10 LAB — ETHANOL: Alcohol, Ethyl (B): <10 mg/dL (ref <10)

## 2022-10-10 LAB — SARS CORONAVIRUS 2 BY RT PCR: SARS Coronavirus 2 by RT PCR: NEGATIVE

## 2022-10-10 MED ORDER — KETOROLAC TROMETHAMINE 15 MG/ML IJ SOLN
15.0000 mg | Freq: Once | INTRAMUSCULAR | Status: AC
  Administered 2022-10-10: 15 mg via INTRAMUSCULAR
  Filled 2022-10-10: qty 1

## 2022-10-10 NOTE — ED Notes (Signed)
Pt given dinner tray.

## 2022-10-10 NOTE — ED Triage Notes (Addendum)
Patient presents with police (IVC'd) for suicidal ideations; Patient states that the "depression medicine" he is on is not helping and told a friend that he wanted to crash his moped and die; Patient admits to buying Suboxone, Klonopin, and cocaine; States that he is in chronic pain and this is the cause of his suicidal thoughts; COWS score of 3

## 2022-10-10 NOTE — ED Notes (Signed)
Pt. Alert and oriented, warm and dry, in no distress. Pt. Denies SI, HI, and AVH. Patient states, "I am really depressed." Patient contracts for safety with Probation officer. Pt. Encouraged to let nursing staff know of any concerns or needs.

## 2022-10-10 NOTE — ED Provider Notes (Addendum)
[facility] Provider Note    Event Date/Time   First MD Initiated Contact with Patient 10/10/22 1639     (approximate)   History   Suicidal  HPI  Corey Moss is a 52 y.o. male  who presents to the ER today from [place] under IVC and and accepted to [facility]. On exam patient's primary complaint is for pain in his chest since having multiple broken rib fractures. Patient has been seen in the emergency department for substance abuse.       Physical Exam   Triage Vital Signs: ED Triage Vitals  Enc Vitals Group     BP 10/10/22 1419 126/60     Pulse Rate 10/10/22 1419 (!) 58     Resp 10/10/22 1419 19     Temp 10/10/22 1419 98.4 F (36.9 C)     Temp Source 10/10/22 1419 Oral     SpO2 10/10/22 1419 98 %     Weight 10/10/22 1420 214 lb (97.1 kg)     Height 10/10/22 1420 5\' 7"  (1.702 m)     Head Circumference --      Peak Flow --      Pain Score 10/10/22 1419 10     Pain Loc --      Pain Edu? --      Excl. in [place]? --     Most recent vital signs: Vitals:   10/10/22 1419  BP: 126/60  Pulse: (!) 58  Resp: 19  Temp: 98.4 F (36.9 C)  SpO2: 98%   General: Awake, alert, oriented. CV:  Good peripheral perfusion.  Resp:  Normal effort.  Abd:  No distention.  Other:  Withdrawn, depressed.   ED Results / Procedures / Treatments   Labs (all labs ordered are listed, but only abnormal results are displayed) Labs Reviewed  URINE DRUG SCREEN, QUALITATIVE ([place]) - Abnormal; Notable for the following components:      Result Value   MDMA (Ecstasy)Ur Screen POSITIVE (*)    Cocaine Metabolite,Ur [place] POSITIVE (*)    Benzodiazepine, Ur Scrn POSITIVE (*)    All other components within normal limits  CBC WITH DIFFERENTIAL/PLATELET - Abnormal; Notable for the following components:   Eosinophils Absolute 0.6 (*)    All other components within normal limits  COMPREHENSIVE METABOLIC PANEL  ETHANOL      EKG  None   RADIOLOGY None   PROCEDURES:  Critical Care performed: No    MEDICATIONS ORDERED IN ED: Medications - No data to display   IMPRESSION / MDM / [place] / ED COURSE  I reviewed the triage vital signs and the nursing notes.                              Differential diagnosis includes, but is not limited to, depression, psychiatric illness, substance induced mood disorder.  Patient's presentation is most consistent with acute presentation with potential threat to life or bodily function.  Patient presented to the emergency department today under IVC with plan for admission to [facility] tomorrow. Medically cleared for inpatient psychiatric treatment.   FINAL CLINICAL IMPRESSION(S) / ED DIAGNOSES   Final diagnoses:  Suicidal ideation  Involuntary commitment     Note:  This document was prepared using Dragon voice recognition software and may include unintentional dictation errors.    Nance Pear, MD 10/10/22 Donzetta Kohut    Nance Pear, MD 10/10/22 [phone]
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
## 2022-10-10 NOTE — ED Notes (Signed)
Pt given coke and warm blanket, pt asking about food, told pt I would put in a diet order and call dining to make sure he gets dinner. After pt complained of losing job over his stay he requested I "get some food or shut the damn door."

## 2022-10-10 NOTE — BH Assessment (Signed)
Patient has been accepted to [facility] on tomm 10/11/22 after 8:00am. Patient was not assigned to a room. Accepting physician is Dr. Leta Jungling.  Call report to [phone].  Representative was [employer].   ER Staff is aware of it:  Apolonio Schneiders, ER Secretary  Dr. Archie Balboa, ER MD  Alroy Dust, Patient's Nurse

## 2022-10-10 NOTE — ED Notes (Signed)
INVOLUNTARY with all papers on chart has been accepted to [facility] in the am 3/28 after 8AM

## 2022-10-11 DIAGNOSIS — Z1152 Encounter for screening for COVID-19: Secondary | ICD-10-CM | POA: Diagnosis not present

## 2022-10-11 DIAGNOSIS — R45851 Suicidal ideations: Secondary | ICD-10-CM

## 2022-10-11 DIAGNOSIS — F330 Major depressive disorder, recurrent, mild: Secondary | ICD-10-CM | POA: Diagnosis not present

## 2022-10-11 DIAGNOSIS — Z9151 Personal history of suicidal behavior: Secondary | ICD-10-CM | POA: Diagnosis not present

## 2022-10-11 DIAGNOSIS — R259 Unspecified abnormal involuntary movements: Secondary | ICD-10-CM | POA: Diagnosis not present

## 2022-10-11 DIAGNOSIS — Z87820 Personal history of traumatic brain injury: Secondary | ICD-10-CM | POA: Diagnosis not present

## 2022-10-11 DIAGNOSIS — Y92410 Unspecified street and highway as the place of occurrence of the external cause: Secondary | ICD-10-CM | POA: Diagnosis not present

## 2022-10-11 DIAGNOSIS — F17210 Nicotine dependence, cigarettes, uncomplicated: Secondary | ICD-10-CM | POA: Diagnosis not present

## 2022-10-11 DIAGNOSIS — F331 Major depressive disorder, recurrent, moderate: Secondary | ICD-10-CM | POA: Diagnosis not present

## 2022-10-11 DIAGNOSIS — F1910 Other psychoactive substance abuse, uncomplicated: Secondary | ICD-10-CM | POA: Diagnosis not present

## 2022-10-11 DIAGNOSIS — S069X0A Unspecified intracranial injury without loss of consciousness, initial encounter: Secondary | ICD-10-CM | POA: Diagnosis not present

## 2022-10-11 DIAGNOSIS — F4310 Post-traumatic stress disorder, unspecified: Secondary | ICD-10-CM | POA: Diagnosis not present

## 2022-10-11 DIAGNOSIS — F1510 Other stimulant abuse, uncomplicated: Secondary | ICD-10-CM | POA: Diagnosis not present

## 2022-10-11 DIAGNOSIS — R45850 Homicidal ideations: Secondary | ICD-10-CM | POA: Diagnosis not present

## 2022-10-11 DIAGNOSIS — G8929 Other chronic pain: Secondary | ICD-10-CM | POA: Diagnosis not present

## 2022-10-11 DIAGNOSIS — F1410 Cocaine abuse, uncomplicated: Secondary | ICD-10-CM | POA: Diagnosis not present

## 2022-10-11 DIAGNOSIS — F419 Anxiety disorder, unspecified: Secondary | ICD-10-CM | POA: Diagnosis not present

## 2022-10-11 DIAGNOSIS — F332 Major depressive disorder, recurrent severe without psychotic features: Secondary | ICD-10-CM | POA: Diagnosis not present

## 2022-10-11 DIAGNOSIS — F1310 Sedative, hypnotic or anxiolytic abuse, uncomplicated: Secondary | ICD-10-CM | POA: Diagnosis not present

## 2022-10-11 DIAGNOSIS — F339 Major depressive disorder, recurrent, unspecified: Secondary | ICD-10-CM

## 2022-10-11 MED ORDER — PAROXETINE HCL 10 MG PO TABS: 10.0000 mg | ORAL_TABLET | Freq: Every day | ORAL | Status: DC

## 2022-10-11 MED ORDER — HYDROCODONE-ACETAMINOPHEN 5-325 MG PO TABS
1.0000 | ORAL_TABLET | Freq: Four times a day (QID) | ORAL | Status: DC | PRN
  Administered 2022-10-11: 1 via ORAL
  Filled 2022-10-11: qty 1

## 2022-10-11 NOTE — BH Assessment (Signed)
TTS attempted to complete an assessment for patient. However, he focus and very vocal about not wanting to be in the ER. Patient curse at staff during the entire time of the assessment. He was difficult to redirect and gain the information about what brought to the ER and why he no longer meets inpatient treatment. Per his report, "you mother fuckers don't know shit and yall holding me against my will." He further shared, he went to [facility] for help with medications and "they don't know shit and didn't help me. Now I'm here and yall stupid mother fuckers don't know shit. Just send me home." When asked questions, he would reply, "send me home you stupid mother fuckers."

## 2022-10-11 NOTE — ED Notes (Signed)
[facility]
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
## 2022-10-11 NOTE — ED Notes (Signed)
Hospital meal provided, pt tolerated w/o complaints.  Waste discarded appropriately Pt offered shower pt denied at this time.
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
## 2022-10-11 NOTE — ED Notes (Signed)
[facility] at bedside for transport.
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
## 2022-10-17 DIAGNOSIS — F419 Anxiety disorder, unspecified: Secondary | ICD-10-CM | POA: Diagnosis not present

## 2022-10-17 DIAGNOSIS — F332 Major depressive disorder, recurrent severe without psychotic features: Secondary | ICD-10-CM | POA: Diagnosis not present

## 2022-10-17 DIAGNOSIS — R45851 Suicidal ideations: Secondary | ICD-10-CM | POA: Diagnosis not present

## 2022-10-17 DIAGNOSIS — F1510 Other stimulant abuse, uncomplicated: Secondary | ICD-10-CM | POA: Diagnosis not present

## 2022-10-17 DIAGNOSIS — F4310 Post-traumatic stress disorder, unspecified: Secondary | ICD-10-CM | POA: Diagnosis not present

## 2022-10-17 DIAGNOSIS — R45850 Homicidal ideations: Secondary | ICD-10-CM | POA: Diagnosis not present

## 2022-10-17 DIAGNOSIS — F1410 Cocaine abuse, uncomplicated: Secondary | ICD-10-CM | POA: Diagnosis not present

## 2022-10-17 DIAGNOSIS — F17210 Nicotine dependence, cigarettes, uncomplicated: Secondary | ICD-10-CM | POA: Diagnosis not present

## 2022-10-17 DIAGNOSIS — Z87820 Personal history of traumatic brain injury: Secondary | ICD-10-CM | POA: Diagnosis not present

## 2022-10-17 DIAGNOSIS — Z9151 Personal history of suicidal behavior: Secondary | ICD-10-CM | POA: Diagnosis not present

## 2022-10-17 DIAGNOSIS — G8929 Other chronic pain: Secondary | ICD-10-CM | POA: Diagnosis not present

## 2022-10-17 DIAGNOSIS — F1310 Sedative, hypnotic or anxiolytic abuse, uncomplicated: Secondary | ICD-10-CM | POA: Diagnosis not present

## 2022-12-03 ENCOUNTER — Other Ambulatory Visit: Payer: Self-pay

## 2022-12-13 ENCOUNTER — Other Ambulatory Visit: Payer: Self-pay

## 2022-12-13 MED ORDER — LAMOTRIGINE 25 MG PO TABS
25.0000 mg | ORAL_TABLET | Freq: Every day | ORAL | 0 refills | Status: DC
  Filled 2022-12-13: qty 30, 30d supply, fill #0

## 2022-12-13 MED ORDER — DULOXETINE HCL 60 MG PO CPEP
60.0000 mg | ORAL_CAPSULE | Freq: Every day | ORAL | 0 refills | Status: DC
  Filled 2022-12-13: qty 30, 30d supply, fill #0

## 2022-12-14 ENCOUNTER — Other Ambulatory Visit: Payer: Self-pay

## 2022-12-17 ENCOUNTER — Other Ambulatory Visit: Payer: Self-pay

## 2023-01-10 ENCOUNTER — Other Ambulatory Visit: Payer: Self-pay

## 2023-01-10 MED ORDER — DULOXETINE HCL 60 MG PO CPEP
60.0000 mg | ORAL_CAPSULE | Freq: Every day | ORAL | 0 refills | Status: DC
  Filled 2023-01-10: qty 30, 30d supply, fill #0

## 2023-01-10 MED ORDER — LAMOTRIGINE 25 MG PO TABS
50.0000 mg | ORAL_TABLET | Freq: Every day | ORAL | 0 refills | Status: DC
  Filled 2023-01-10: qty 60, 30d supply, fill #0

## 2023-02-26 ENCOUNTER — Other Ambulatory Visit: Payer: Self-pay

## 2023-02-26 MED ORDER — DULOXETINE HCL 60 MG PO CPEP
60.0000 mg | ORAL_CAPSULE | Freq: Every day | ORAL | 0 refills | Status: DC
  Filled 2023-02-26: qty 30, 30d supply, fill #0

## 2023-02-26 MED ORDER — LAMOTRIGINE 25 MG PO TABS
50.0000 mg | ORAL_TABLET | Freq: Every day | ORAL | 0 refills | Status: DC
  Filled 2023-02-26: qty 60, 30d supply, fill #0

## 2023-03-01 ENCOUNTER — Other Ambulatory Visit: Payer: Self-pay

## 2023-03-07 ENCOUNTER — Other Ambulatory Visit: Payer: Self-pay

## 2023-03-07 MED ORDER — LAMOTRIGINE 25 MG PO TABS
75.0000 mg | ORAL_TABLET | Freq: Every day | ORAL | 1 refills | Status: AC
  Filled 2023-03-07 – 2023-03-25 (×2): qty 90, 30d supply, fill #0

## 2023-03-07 MED ORDER — DULOXETINE HCL 60 MG PO CPEP
60.0000 mg | ORAL_CAPSULE | Freq: Every day | ORAL | 1 refills | Status: AC
  Filled 2023-03-07 – 2023-03-28 (×2): qty 30, 30d supply, fill #0
  Filled 2023-11-26: qty 30, 30d supply, fill #1

## 2023-03-08 ENCOUNTER — Other Ambulatory Visit: Payer: Self-pay

## 2023-03-25 ENCOUNTER — Other Ambulatory Visit: Payer: Self-pay

## 2023-03-28 ENCOUNTER — Other Ambulatory Visit: Payer: Self-pay

## 2023-03-29 ENCOUNTER — Other Ambulatory Visit: Payer: Self-pay

## 2023-04-02 ENCOUNTER — Encounter: Payer: Self-pay | Admitting: Gerontology

## 2023-04-02 ENCOUNTER — Ambulatory Visit: Payer: Medicaid Other | Admitting: Gerontology

## 2023-04-02 VITALS — BP 122/78 | HR 68 | Resp 16 | Wt 229.1 lb

## 2023-04-02 DIAGNOSIS — Z8659 Personal history of other mental and behavioral disorders: Secondary | ICD-10-CM | POA: Insufficient documentation

## 2023-04-02 DIAGNOSIS — Z7689 Persons encountering health services in other specified circumstances: Secondary | ICD-10-CM | POA: Insufficient documentation

## 2023-04-02 DIAGNOSIS — F17200 Nicotine dependence, unspecified, uncomplicated: Secondary | ICD-10-CM
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
## 2023-04-02 NOTE — Patient Instructions (Signed)
Health Risks of Smoking Smoking tobacco is very bad for your health. Tobacco smoke contains many toxic chemicals that can damage every part of your body. Secondhand smoke can be harmful to those around you. Tobacco or nicotine use can cause many long-term (chronic) diseases. Smoking is difficult to quit because a chemical in tobacco, called nicotine, causes addiction or dependence. When you smoke and inhale, nicotine is absorbed quickly into your bloodstream through your lungs. Both inhaled and non-inhaled nicotine may be addictive. How can quitting affect me? There are health benefits of quitting smoking. Some benefits happen right away and others take time. Benefits may include: Blood flow, blood pressure, heart rate, and lung capacity may begin to improve. However, any lung damage that has already occurred cannot be repaired. Respiratory symptoms from smoking, such as nasal congestion and cough, may improve over time. Your risk of heart disease, stroke, and cancer is reduced. The overall quality of your health may improve. You may save money, as you will not spend money on tobacco products and may spend less money on smoking-related health issues. What can increase my risk? Smoking harms nearly every organ in the body. People who smoke tobacco have a shorter life expectancy and an increased risk of many serious medical problems. These include: More respiratory infections, such as colds and pneumonia. Cancer. Heart disease. Stroke. Chronic respiratory diseases. Delayed wound healing and increased risk of complications during surgery. Problems with reproduction, pregnancy, and childbirth, such as infertility, early (premature) births, stillbirths, and birth defects. Secondhand smoke exposure to children increases the risk of: Sudden infant death syndrome (SIDS). Infections in the nose, throat, or airways (respiratory infections). Chronic respiratory symptoms. What actions can I take to  quit? Smoking is an addiction that affects both your body and your mind, and long-time habits can be hard to change. Your health care provider can recommend: Nicotine replacement products, such as patches, gum, and nasal sprays. Use these products only as directed. Do not replace cigarette smoking with electronic cigarettes, which are commonly called e-cigarettes. The safety of e-cigarettes is not known, and some may contain harmful chemicals. Programs and community resources, which may include group support, education, or talk therapy. Prescription medicines to help reduce cravings. A combination of two or more quit methods, which may increase the success of quitting. Where to find support Follow the recommendations from your health care provider about support groups and other assistance. You can also visit: U.S. Department of Health and Human Services: www.smokefree.gov American Lung Association: www.freedomfromsmoking.org American Heart Association: www.heart.org Where to find more information Centers for Disease Control and Prevention: FootballExhibition.com.br World Health Organization: https://castaneda-walker.com/ Summary Smoking tobacco is very bad for your health. Tobacco smoke contains many toxic chemicals that can damage every part of the body. Smoking is difficult to quit because a chemical in tobacco, called nicotine, causes addiction or dependence. There are immediate and long-term health benefits of quitting smoking. A combination of two or more quit methods may increase the success of quitting. This information is not intended to replace advice given to you by your health care provider. Make sure you discuss any questions you have with your health care provider. Document Revised: 07/04/2021 Document Reviewed: 07/04/2021 Elsevier Patient Education  2024 [employer].

## 2023-04-03 ENCOUNTER — Other Ambulatory Visit: Payer: Self-pay

## 2023-04-03 DIAGNOSIS — Z7689 Persons encountering health services in other specified circumstances: Secondary | ICD-10-CM

## 2023-04-04 LAB — LIPID PANEL
Chol/HDL Ratio: 5.5 ratio — ABNORMAL HIGH (ref 0.0–5.0)
Cholesterol, Total: 215 mg/dL — ABNORMAL HIGH (ref 100–199)
HDL: 39 mg/dL — ABNORMAL LOW (ref >39)
LDL Chol Calc (NIH): 147 mg/dL — ABNORMAL HIGH (ref 0–99)
Triglycerides: 157 mg/dL — ABNORMAL HIGH (ref 0–149)
VLDL Cholesterol Cal: 29 mg/dL (ref 5–40)

## 2023-04-04 LAB — CBC WITH DIFFERENTIAL/PLATELET
Basophils Absolute: 0.1 10*3/uL (ref 0.0–0.2)
Basos: 2 %
EOS (ABSOLUTE): 0.5 10*3/uL — ABNORMAL HIGH (ref 0.0–0.4)
Eos: 6 %
Hematocrit: 48.5 % (ref 37.5–51.0)
Hemoglobin: 16.2 g/dL (ref 13.0–17.7)
Immature Grans (Abs): 0 10*3/uL (ref 0.0–0.1)
Immature Granulocytes: 0 %
Lymphocytes Absolute: 2.7 10*3/uL (ref 0.7–3.1)
Lymphs: 32 %
MCH: 31.5 pg (ref 26.6–33.0)
MCHC: 33.4 g/dL (ref 31.5–35.7)
MCV: 94 fL (ref 79–97)
Monocytes Absolute: 0.7 10*3/uL (ref 0.1–0.9)
Monocytes: 8 %
Neutrophils Absolute: 4.3 10*3/uL (ref 1.4–7.0)
Neutrophils: 52 %
Platelets: 286 10*3/uL (ref 150–450)
RBC: 5.15 x10E6/uL (ref 4.14–5.80)
RDW: 13.1 % (ref 11.6–15.4)
WBC: 8.2 10*3/uL (ref 3.4–10.8)

## 2023-04-04 LAB — COMPREHENSIVE METABOLIC PANEL
ALT: 13 IU/L (ref 0–44)
AST: 14 IU/L (ref 0–40)
Albumin: 4.4 g/dL (ref 4.1–5.1)
Alkaline Phosphatase: 94 IU/L (ref 44–121)
BUN/Creatinine Ratio: 11 (ref 9–20)
BUN: 10 mg/dL (ref 6–24)
Bilirubin Total: 0.2 mg/dL (ref 0.0–1.2)
CO2: 26 mmol/L (ref 20–29)
Calcium: 9.7 mg/dL (ref 8.7–10.2)
Chloride: 101 mmol/L (ref 96–106)
Creatinine, Ser: 0.87 mg/dL (ref 0.76–1.27)
Globulin, Total: 2.6 g/dL (ref 1.5–4.5)
Glucose: 90 mg/dL (ref 70–99)
Potassium: 5 mmol/L (ref 3.5–5.2)
Sodium: 141 mmol/L (ref 134–144)
Total Protein: 7 g/dL (ref 6.0–8.5)
eGFR: 106 mL/min/{1.73_m2} (ref >59)

## 2023-04-04 LAB — HEMOGLOBIN A1C
Est. average glucose Bld gHb Est-mCnc: 111 mg/dL
Hgb A1c MFr Bld: 5.5 % (ref 4.8–5.6)

## 2023-04-06 DIAGNOSIS — Z8781 Personal history of (healed) traumatic fracture: Secondary | ICD-10-CM | POA: Insufficient documentation

## 2023-04-09 ENCOUNTER — Ambulatory Visit: Payer: Self-pay | Admitting: Nurse Practitioner

## 2023-04-09 ENCOUNTER — Ambulatory Visit: Payer: Self-pay | Admitting: Gerontology

## 2023-04-09 DIAGNOSIS — Z8781 Personal history of (healed) traumatic fracture: Secondary | ICD-10-CM

## 2023-04-10 ENCOUNTER — Ambulatory Visit: Payer: Self-pay | Admitting: Gerontology

## 2023-04-10 ENCOUNTER — Encounter: Payer: Self-pay | Admitting: Gerontology

## 2023-04-10 VITALS — BP 116/79 | HR 78 | Ht 67.0 in | Wt 228.4 lb

## 2023-04-10 DIAGNOSIS — F339 Major depressive disorder, recurrent, unspecified: Secondary | ICD-10-CM

## 2023-04-10 DIAGNOSIS — E785 Hyperlipidemia, unspecified: Secondary | ICD-10-CM | POA: Insufficient documentation

## 2023-04-10 NOTE — Patient Instructions (Signed)
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
## 2023-04-19 ENCOUNTER — Other Ambulatory Visit: Payer: Self-pay

## 2023-04-19 MED ORDER — LAMOTRIGINE 100 MG PO TABS
100.0000 mg | ORAL_TABLET | Freq: Every day | ORAL | 1 refills | Status: DC
  Filled 2023-04-19 – 2023-05-02 (×2): qty 30, 30d supply, fill #0
  Filled 2023-06-03: qty 30, 30d supply, fill #1

## 2023-04-19 MED ORDER — DULOXETINE HCL 60 MG PO CPEP
60.0000 mg | ORAL_CAPSULE | Freq: Every day | ORAL | 1 refills | Status: AC
  Filled 2023-04-19 – 2023-05-02 (×3): qty 30, 30d supply, fill #0
  Filled 2023-06-03: qty 30, 30d supply, fill #1

## 2023-04-24 ENCOUNTER — Ambulatory Visit: Payer: Self-pay | Admitting: Gerontology

## 2023-04-30 ENCOUNTER — Other Ambulatory Visit: Payer: Self-pay

## 2023-05-01 ENCOUNTER — Other Ambulatory Visit: Payer: Self-pay

## 2023-05-02 ENCOUNTER — Other Ambulatory Visit: Payer: Self-pay

## 2023-05-17 ENCOUNTER — Other Ambulatory Visit: Payer: Self-pay

## 2023-05-17 MED ORDER — DULOXETINE HCL 30 MG PO CPEP
30.0000 mg | ORAL_CAPSULE | Freq: Every morning | ORAL | 0 refills | Status: DC
  Filled 2023-05-17: qty 30, 30d supply, fill #0

## 2023-05-23 ENCOUNTER — Other Ambulatory Visit: Payer: Self-pay

## 2023-06-03 ENCOUNTER — Other Ambulatory Visit: Payer: Self-pay

## 2023-06-18 ENCOUNTER — Other Ambulatory Visit: Payer: Self-pay

## 2023-06-20 ENCOUNTER — Other Ambulatory Visit: Payer: Self-pay

## 2023-06-20 MED ORDER — CYMBALTA 60 MG PO CPEP
60.0000 mg | ORAL_CAPSULE | Freq: Every day | ORAL | 1 refills | Status: AC
  Filled 2023-06-20 – 2023-07-04 (×2): qty 30, 30d supply, fill #0
  Filled 2024-01-02 – 2024-04-29 (×3): qty 30, 30d supply, fill #1

## 2023-06-20 MED ORDER — LAMOTRIGINE 100 MG PO TABS
100.0000 mg | ORAL_TABLET | Freq: Every day | ORAL | 1 refills | Status: DC
  Filled 2023-06-20 – 2023-07-04 (×3): qty 30, 30d supply, fill #0
  Filled 2024-01-15: qty 30, 30d supply, fill #1

## 2023-06-20 MED ORDER — DULOXETINE HCL 30 MG PO CPEP
30.0000 mg | ORAL_CAPSULE | Freq: Every morning | ORAL | 1 refills | Status: DC
  Filled 2023-06-20: qty 30, 30d supply, fill #0
  Filled 2023-07-22: qty 30, 30d supply, fill #1

## 2023-06-20 MED ORDER — VRAYLAR 1.5 MG PO CAPS
1.5000 mg | ORAL_CAPSULE | Freq: Every day | ORAL | 1 refills | Status: DC
  Filled 2023-07-22 – 2023-08-02 (×2): qty 30, 30d supply, fill #0
  Filled 2023-09-05 (×2): qty 30, 30d supply, fill #1

## 2023-06-21 ENCOUNTER — Other Ambulatory Visit: Payer: Self-pay

## 2023-07-04 ENCOUNTER — Other Ambulatory Visit: Payer: Self-pay

## 2023-07-22 ENCOUNTER — Other Ambulatory Visit: Payer: Self-pay

## 2023-07-26 ENCOUNTER — Other Ambulatory Visit: Payer: Self-pay

## 2023-07-26 MED ORDER — DULOXETINE HCL 60 MG PO CPEP
60.0000 mg | ORAL_CAPSULE | Freq: Every day | ORAL | 1 refills | Status: AC
  Filled 2023-07-26: qty 30, 30d supply, fill #0
  Filled 2023-08-27: qty 30, 30d supply, fill #1

## 2023-07-26 MED ORDER — LAMOTRIGINE 100 MG PO TABS
100.0000 mg | ORAL_TABLET | Freq: Every day | ORAL | 1 refills | Status: AC
  Filled 2023-07-26: qty 30, 30d supply, fill #0
  Filled 2023-08-27: qty 30, 30d supply, fill #1

## 2023-07-26 MED ORDER — DULOXETINE HCL 30 MG PO CPEP
30.0000 mg | ORAL_CAPSULE | Freq: Every morning | ORAL | 1 refills | Status: AC
  Filled 2023-08-21: qty 30, 30d supply, fill #0
  Filled 2023-09-24: qty 30, 30d supply, fill #1

## 2023-07-29 ENCOUNTER — Other Ambulatory Visit: Payer: Self-pay

## 2023-07-30 ENCOUNTER — Other Ambulatory Visit: Payer: Self-pay

## 2023-08-02 ENCOUNTER — Other Ambulatory Visit: Payer: Self-pay

## 2023-08-05 ENCOUNTER — Other Ambulatory Visit: Payer: Self-pay

## 2023-08-06 ENCOUNTER — Other Ambulatory Visit: Payer: Self-pay

## 2023-08-21 ENCOUNTER — Other Ambulatory Visit: Payer: Self-pay

## 2023-08-27 ENCOUNTER — Other Ambulatory Visit: Payer: Self-pay

## 2023-09-02 ENCOUNTER — Other Ambulatory Visit: Payer: Self-pay

## 2023-09-05 ENCOUNTER — Other Ambulatory Visit: Payer: Self-pay

## 2023-09-12 ENCOUNTER — Other Ambulatory Visit: Payer: Self-pay

## 2023-09-12 MED ORDER — CARIPRAZINE HCL 1.5 MG PO CAPS
1.5000 mg | ORAL_CAPSULE | Freq: Every day | ORAL | 3 refills | Status: AC
  Filled 2024-02-17: qty 90, 90d supply, fill #0

## 2023-09-13 ENCOUNTER — Other Ambulatory Visit: Payer: Self-pay

## 2023-09-13 MED ORDER — VRAYLAR 1.5 MG PO CAPS: 1.5000 mg | ORAL_CAPSULE | Freq: Every day | ORAL | 1 refills | Status: AC

## 2023-09-13 MED ORDER — DULOXETINE HCL 30 MG PO CPEP
30.0000 mg | ORAL_CAPSULE | Freq: Every morning | ORAL | 1 refills | Status: DC
  Filled 2023-10-23: qty 30, 30d supply, fill #0
  Filled 2023-11-26: qty 30, 30d supply, fill #1

## 2023-09-13 MED ORDER — LAMOTRIGINE 100 MG PO TABS
100.0000 mg | ORAL_TABLET | Freq: Every day | ORAL | 1 refills | Status: AC
  Filled 2023-10-08: qty 30, 30d supply, fill #0
  Filled 2023-11-03: qty 30, 30d supply, fill #1

## 2023-09-13 MED ORDER — CYMBALTA 60 MG PO CPEP
60.0000 mg | ORAL_CAPSULE | Freq: Every day | ORAL | 1 refills | Status: AC
  Filled 2023-10-08: qty 30, 30d supply, fill #0
  Filled 2023-10-29: qty 30, 30d supply, fill #1

## 2023-09-24 ENCOUNTER — Other Ambulatory Visit: Payer: Self-pay

## 2023-10-06 ENCOUNTER — Other Ambulatory Visit: Payer: Self-pay

## 2023-10-07 ENCOUNTER — Other Ambulatory Visit: Payer: Self-pay

## 2023-10-08 ENCOUNTER — Other Ambulatory Visit: Payer: Self-pay

## 2023-10-22 ENCOUNTER — Ambulatory Visit: Payer: Self-pay | Admitting: Nurse Practitioner

## 2023-10-23 ENCOUNTER — Other Ambulatory Visit: Payer: Self-pay

## 2023-10-24 ENCOUNTER — Other Ambulatory Visit: Payer: Self-pay

## 2023-10-29 ENCOUNTER — Other Ambulatory Visit: Payer: Self-pay

## 2023-10-30 ENCOUNTER — Other Ambulatory Visit: Payer: Self-pay

## 2023-11-03 ENCOUNTER — Other Ambulatory Visit: Payer: Self-pay

## 2023-11-07 ENCOUNTER — Other Ambulatory Visit: Payer: Self-pay

## 2023-11-20 ENCOUNTER — Other Ambulatory Visit: Payer: Self-pay

## 2023-11-26 ENCOUNTER — Other Ambulatory Visit: Payer: Self-pay

## 2023-11-28 ENCOUNTER — Other Ambulatory Visit: Payer: Self-pay

## 2023-11-28 MED ORDER — DULOXETINE HCL 60 MG PO CPEP
60.0000 mg | ORAL_CAPSULE | Freq: Every day | ORAL | 0 refills | Status: DC
  Filled 2024-01-15: qty 30, 30d supply, fill #0

## 2023-11-28 MED ORDER — VRAYLAR 1.5 MG PO CAPS
1.5000 mg | ORAL_CAPSULE | Freq: Every day | ORAL | 1 refills | Status: DC
  Filled 2023-11-28: qty 30, 30d supply, fill #0

## 2023-11-28 MED ORDER — LAMOTRIGINE 100 MG PO TABS
100.0000 mg | ORAL_TABLET | Freq: Every day | ORAL | 0 refills | Status: AC
  Filled 2023-11-28: qty 30, 30d supply, fill #0

## 2023-11-28 MED ORDER — DULOXETINE HCL 30 MG PO CPEP
30.0000 mg | ORAL_CAPSULE | Freq: Every morning | ORAL | 0 refills | Status: DC
Start: 2023-11-28 — End: 2024-01-21
  Filled 2024-01-01: qty 30, 30d supply, fill #0

## 2023-12-10 ENCOUNTER — Other Ambulatory Visit: Payer: Self-pay

## 2023-12-12 ENCOUNTER — Other Ambulatory Visit: Payer: Self-pay

## 2023-12-24 ENCOUNTER — Other Ambulatory Visit: Payer: Self-pay

## 2024-01-01 ENCOUNTER — Other Ambulatory Visit: Payer: Self-pay

## 2024-01-02 ENCOUNTER — Other Ambulatory Visit: Payer: Self-pay

## 2024-01-13 ENCOUNTER — Other Ambulatory Visit: Payer: Self-pay

## 2024-01-14 ENCOUNTER — Other Ambulatory Visit: Payer: Self-pay

## 2024-01-15 ENCOUNTER — Other Ambulatory Visit: Payer: Self-pay

## 2024-01-16 ENCOUNTER — Other Ambulatory Visit: Payer: Self-pay

## 2024-01-20 ENCOUNTER — Other Ambulatory Visit: Payer: Self-pay

## 2024-01-21 ENCOUNTER — Other Ambulatory Visit: Payer: Self-pay

## 2024-01-21 MED ORDER — LAMOTRIGINE 100 MG PO TABS
100.0000 mg | ORAL_TABLET | Freq: Every day | ORAL | 0 refills | Status: DC
  Filled 2024-02-18: qty 30, 30d supply, fill #0

## 2024-01-21 MED ORDER — VRAYLAR 1.5 MG PO CAPS: 1.5000 mg | ORAL_CAPSULE | Freq: Every day | ORAL | 1 refills | Status: AC

## 2024-01-21 MED ORDER — DULOXETINE HCL 30 MG PO CPEP
30.0000 mg | ORAL_CAPSULE | Freq: Every morning | ORAL | 0 refills | Status: DC
  Filled 2024-02-04 – 2024-02-05 (×2): qty 30, 30d supply, fill #0

## 2024-01-21 MED ORDER — DULOXETINE HCL 60 MG PO CPEP
60.0000 mg | ORAL_CAPSULE | Freq: Every day | ORAL | 0 refills | Status: AC
  Filled 2024-02-04 – 2024-02-13 (×3): qty 30, 30d supply, fill #0

## 2024-01-30 ENCOUNTER — Ambulatory Visit: Payer: Self-pay | Admitting: Gerontology

## 2024-01-30 ENCOUNTER — Encounter: Payer: Self-pay | Admitting: Gerontology

## 2024-01-30 ENCOUNTER — Other Ambulatory Visit: Payer: Self-pay

## 2024-01-30 VITALS — BP 130/84 | HR 68 | Resp 95 | Ht 67.0 in | Wt 241.3 lb

## 2024-01-30 DIAGNOSIS — Z0000 Encounter for general adult medical examination without abnormal findings: Secondary | ICD-10-CM

## 2024-01-30 NOTE — Progress Notes (Signed)
 Established Patient Office Visit  Subjective   [name], male    DOB: 02/16/1971  Age: 53 y.o. MRN: [id number]  Chief Complaint  Patient presents with   Follow-up    HPI  Corey Moss is a 53 y/o male who has a history of  depression, anxiety, prediabetes and presents for follow up visit and lab review.  He missed a couple of his follow-up appointment.  He continues to reside at  [facility] for rehabitation.  He states that he is compliant with his medications, denies side effects and continues to make healthy lifestyle changes.  He follows up at [facility] for his mental health care.  He states that his mood is good, denies suicidal or homicidal ideation.  Overall, he states that he is doing well and offers no further complaints.  Review of Systems  Constitutional: Negative.   Eyes: Negative.   Respiratory: Negative.    Cardiovascular: Negative.   Gastrointestinal: Negative.   Genitourinary: Negative.   Musculoskeletal: Negative.   Neurological: Negative.   Endo/Heme/Allergies: Negative.   Psychiatric/Behavioral: Negative.        Objective:     BP 130/84 (BP Location: Right Arm, Patient Position: Sitting, Cuff Size: Large)   Pulse 68   Resp (!) 95   Ht 5' 7 (1.702 m)   Wt 241 lb 4.8 oz (109.5 kg)   BMI 37.79 kg/m  BP Readings from Last 3 Encounters:  01/30/24 130/84  04/10/23 116/79  04/02/23 122/78   Wt Readings from Last 3 Encounters:  01/30/24 241 lb 4.8 oz (109.5 kg)  04/10/23 228 lb 6.4 oz (103.6 kg)  04/02/23 229 lb 1.6 oz (103.9 kg)      Physical Exam HENT:     Head: Normocephalic and atraumatic.     Mouth/Throat:     Mouth: Mucous membranes are moist.     Pharynx: Oropharynx is clear.  Eyes:     Extraocular Movements: Extraocular movements intact.     Pupils: Pupils are equal, round, and reactive to light.  Cardiovascular:     Rate and Rhythm: Normal rate and regular rhythm.     Pulses: Normal pulses.     Heart sounds: Normal  heart sounds.  Pulmonary:     Effort: Pulmonary effort is normal.     Breath sounds: Normal breath sounds.  Musculoskeletal:        General: Normal range of motion.  Skin:    General: Skin is warm.     Capillary Refill: Capillary refill takes less than 2 seconds.  Neurological:     General: No focal deficit present.     Mental Status: He is alert and oriented to person, place, and time.  Psychiatric:        Mood and Affect: Mood normal.        Behavior: Behavior normal.        Thought Content: Thought content normal.        Judgment: Judgment normal.      No results found for any visits on 01/30/24.  Last CBC Lab Results  Component Value Date   WBC 8.2 04/03/2023   HGB 16.2 04/03/2023   HCT 48.5 04/03/2023   MCV 94 04/03/2023   MCH 31.5 04/03/2023   RDW 13.1 04/03/2023   PLT 286 04/03/2023   Last metabolic panel Lab Results  Component Value Date   GLUCOSE 90 04/03/2023   NA 141 04/03/2023   K 5.0 04/03/2023   CL 101 04/03/2023  CO2 26 04/03/2023   BUN 10 04/03/2023   CREATININE 0.87 04/03/2023   EGFR 106 04/03/2023   CALCIUM 9.7 04/03/2023   PROT 7.0 04/03/2023   ALBUMIN  4.4 04/03/2023   LABGLOB 2.6 04/03/2023   AGRATIO 1.6 02/17/2020   BILITOT 0.2 04/03/2023   ALKPHOS 94 04/03/2023   AST 14 04/03/2023   ALT 13 04/03/2023   ANIONGAP 7 10/10/2022   Last lipids Lab Results  Component Value Date   CHOL 215 (H) 04/03/2023   HDL 39 (L) 04/03/2023   LDLCALC 147 (H) 04/03/2023   TRIG 157 (H) 04/03/2023   CHOLHDL 5.5 (H) 04/03/2023   Last hemoglobin A1c Lab Results  Component Value Date   HGBA1C 5.5 04/03/2023   Last thyroid functions Lab Results  Component Value Date   TSH 0.340 (L) 08/17/2020   Last vitamin D  Lab Results  Component Value Date   VD25OH 20.2 (L) 08/17/2020   Last vitamin B12 and Folate No results found for: VITAMINB12, FOLATE    The 10-year ASCVD risk score (Arnett DK, et al., 2019) is: 11.2%    Assessment & Plan:    1. Health care maintenance (Primary) -Routine labs will be checked and he was advised to check his blood pressure daily [facility], record and bring the log to follow-up appointment. - CBC w/Diff; Future - Comp Met (CMET); Future - Lipid panel; Future - HgB A1c; Future - TSH; Future   Return in about 12 days (around 02/11/2024), or if symptoms worsen or fail to improve, for F/U 02/11/24 in clinic.    Sheridan Gettel E Yolette Hastings, NP

## 2024-01-31 LAB — CBC WITH DIFFERENTIAL/PLATELET
Basophils Absolute: 0.1 x10E3/uL (ref 0.0–0.2)
Basos: 1 %
EOS (ABSOLUTE): 0.4 x10E3/uL (ref 0.0–0.4)
Eos: 5 %
Hematocrit: 45.3 % (ref 37.5–51.0)
Hemoglobin: 15 g/dL (ref 13.0–17.7)
Immature Grans (Abs): 0 x10E3/uL (ref 0.0–0.1)
Immature Granulocytes: 0 %
Lymphocytes Absolute: 2.5 x10E3/uL (ref 0.7–3.1)
Lymphs: 31 %
MCH: 30.7 pg (ref 26.6–33.0)
MCHC: 33.1 g/dL (ref 31.5–35.7)
MCV: 93 fL (ref 79–97)
Monocytes Absolute: 0.5 x10E3/uL (ref 0.1–0.9)
Monocytes: 6 %
Neutrophils Absolute: 4.6 x10E3/uL (ref 1.4–7.0)
Neutrophils: 56 %
Platelets: 307 x10E3/uL (ref 150–450)
RBC: 4.89 x10E6/uL (ref 4.14–5.80)
RDW: 12.9 % (ref 11.6–15.4)
WBC: 8.2 x10E3/uL (ref 3.4–10.8)

## 2024-01-31 LAB — LIPID PANEL
Chol/HDL Ratio: 5.4 ratio — ABNORMAL HIGH (ref 0.0–5.0)
Cholesterol, Total: 210 mg/dL — ABNORMAL HIGH (ref 100–199)
HDL: 39 mg/dL — ABNORMAL LOW (ref >39)
LDL Chol Calc (NIH): 115 mg/dL — ABNORMAL HIGH (ref 0–99)
Triglycerides: 322 mg/dL — ABNORMAL HIGH (ref 0–149)
VLDL Cholesterol Cal: 56 mg/dL — ABNORMAL HIGH (ref 5–40)

## 2024-01-31 LAB — COMPREHENSIVE METABOLIC PANEL WITH GFR
ALT: 30 IU/L (ref 0–44)
AST: 23 IU/L (ref 0–40)
Albumin: 4.5 g/dL (ref 4.1–5.1)
Alkaline Phosphatase: 95 IU/L (ref 44–121)
BUN/Creatinine Ratio: 11 (ref 9–20)
BUN: 14 mg/dL (ref 6–24)
Bilirubin Total: <0.2 mg/dL (ref 0.0–1.2)
CO2: 22 mmol/L (ref 20–29)
Calcium: 9.3 mg/dL (ref 8.7–10.2)
Chloride: 102 mmol/L (ref 96–106)
Creatinine, Ser: 1.27 mg/dL (ref 0.76–1.27)
Globulin, Total: 2.6 g/dL (ref 1.5–4.5)
Glucose: 86 mg/dL (ref 70–99)
Potassium: 4.3 mmol/L (ref 3.5–5.2)
Sodium: 139 mmol/L (ref 134–144)
Total Protein: 7.1 g/dL (ref 6.0–8.5)
eGFR: 69 mL/min/1.73 (ref >59)

## 2024-01-31 LAB — HEMOGLOBIN A1C
Est. average glucose Bld gHb Est-mCnc: 114 mg/dL
Hgb A1c MFr Bld: 5.6 % (ref 4.8–5.6)

## 2024-01-31 LAB — TSH: TSH: 0.339 u[IU]/mL — ABNORMAL LOW (ref 0.450–4.500)

## 2024-02-04 ENCOUNTER — Other Ambulatory Visit: Payer: Self-pay

## 2024-02-05 ENCOUNTER — Other Ambulatory Visit: Payer: Self-pay

## 2024-02-11 ENCOUNTER — Other Ambulatory Visit: Payer: Self-pay

## 2024-02-11 ENCOUNTER — Ambulatory Visit: Payer: Self-pay | Admitting: Gerontology

## 2024-02-11 ENCOUNTER — Encounter: Payer: Self-pay | Admitting: Gerontology

## 2024-02-11 VITALS — BP 135/83 | HR 67 | Wt 236.9 lb

## 2024-02-11 DIAGNOSIS — F339 Major depressive disorder, recurrent, unspecified: Secondary | ICD-10-CM

## 2024-02-11 DIAGNOSIS — E785 Hyperlipidemia, unspecified: Secondary | ICD-10-CM

## 2024-02-11 DIAGNOSIS — Z0000 Encounter for general adult medical examination without abnormal findings: Secondary | ICD-10-CM

## 2024-02-11 NOTE — Patient Instructions (Signed)

## 2024-02-11 NOTE — Progress Notes (Signed)
 Established Patient Office Visit  Subjective   [name], male    DOB: [birth year]  Age: 53 y.o. MRN: [id number]  No chief complaint on file.   HPI  Corey Moss is a 53 y/o male who has a history of  depression, anxiety, prediabetes and presents for follow up visit and lab review. He states that he's compliant with his medication, denies side effects,  continues to make healthy lifestyle changes. He continues to reside at [facility] for rehabilitation. His labs done on 01/30/24 was unremarkable, except LDL was 115 mg/dl, HDL was 39 mg/dl, Triglycerides 677 mg/dl and Total cholesterol was 210 mg/dl. He denies chest pain, palpitation, shortness of breath and vision changes. He states that his mood is good, denies suicidal nor homicidal ideation. Overall, he states that he's doing well and offers no further complaint.  Review of Systems  Constitutional: Negative.   Respiratory: Negative.    Cardiovascular: Negative.   Musculoskeletal: Negative.   Skin: Negative.   Neurological: Negative.   Endo/Heme/Allergies: Negative.   Psychiatric/Behavioral: Negative.        Objective:     BP 135/83 (BP Location: Left Arm, Patient Position: Sitting)   Pulse 67   Wt 236 lb 14.4 oz (107.5 kg)   BMI 37.10 kg/m  BP Readings from Last 3 Encounters:  02/11/24 135/83  01/30/24 130/84  04/10/23 116/79   Wt Readings from Last 3 Encounters:  02/11/24 236 lb 14.4 oz (107.5 kg)  01/30/24 241 lb 4.8 oz (109.5 kg)  04/10/23 228 lb 6.4 oz (103.6 kg)      Physical Exam HENT:     Head: Normocephalic and atraumatic.     Mouth/Throat:     Mouth: Mucous membranes are moist.     Pharynx: Oropharynx is clear.  Eyes:     Extraocular Movements: Extraocular movements intact.     Pupils: Pupils are equal, round, and reactive to light.  Cardiovascular:     Rate and Rhythm: Normal rate and regular rhythm.     Pulses: Normal pulses.     Heart sounds: Normal heart sounds.   Pulmonary:     Effort: Pulmonary effort is normal.     Breath sounds: Normal breath sounds.  Skin:    General: Skin is warm.  Neurological:     General: No focal deficit present.     Mental Status: He is alert and oriented to person, place, and time.  Psychiatric:        Mood and Affect: Mood normal.        Behavior: Behavior normal.        Thought Content: Thought content normal.        Judgment: Judgment normal.      No results found for any visits on 02/11/24.  Last CBC Lab Results  Component Value Date   WBC 8.2 01/30/2024   HGB 15.0 01/30/2024   HCT 45.3 01/30/2024   MCV 93 01/30/2024   MCH 30.7 01/30/2024   RDW 12.9 01/30/2024   PLT 307 01/30/2024   Last metabolic panel Lab Results  Component Value Date   GLUCOSE 86 01/30/2024   NA 139 01/30/2024   K 4.3 01/30/2024   CL 102 01/30/2024   CO2 22 01/30/2024   BUN 14 01/30/2024   CREATININE 1.27 01/30/2024   EGFR 69 01/30/2024   CALCIUM 9.3 01/30/2024   PROT 7.1 01/30/2024   ALBUMIN  4.5 01/30/2024   LABGLOB 2.6 01/30/2024   AGRATIO 1.6 02/17/2020  BILITOT <0.2 01/30/2024   ALKPHOS 95 01/30/2024   AST 23 01/30/2024   ALT 30 01/30/2024   ANIONGAP 7 10/10/2022   Last lipids Lab Results  Component Value Date   CHOL 210 (H) 01/30/2024   HDL 39 (L) 01/30/2024   LDLCALC 115 (H) 01/30/2024   TRIG 322 (H) 01/30/2024   CHOLHDL 5.4 (H) 01/30/2024   Last hemoglobin A1c Lab Results  Component Value Date   HGBA1C 5.6 01/30/2024   Last thyroid functions Lab Results  Component Value Date   TSH 0.339 (L) 01/30/2024   Last vitamin D  Lab Results  Component Value Date   VD25OH 20.2 (L) 08/17/2020   Last vitamin B12 and Folate No results found for: VITAMINB12, FOLATE    The 10-year ASCVD risk score (Arnett DK, et al., 2019) is: 11.5%    Assessment & Plan:   1. Elevated lipids (Primary) - The 10-year ASCVD risk score (Arnett DK, et al., 2019) is: 11.5%   Values used to calculate the score:      Age: 74 years     Clincally relevant sex: Male     Is Non-Hispanic African American: No     Diabetic: No     Tobacco smoker: Yes     Systolic Blood Pressure: 135 mmHg     Is BP treated: No     HDL Cholesterol: 39 mg/dL     Total Cholesterol: 210 mg/dL His ASCVD is 88.4%, he declines Statin therapy, he was advised to continue on low fat/cholesterol diet and exercise as tolerated.  2. Health care maintenance - Will recheck Vitamin D , was advised to get sun light. - VITAMIN D  25 Hydroxy (Vit-D Deficiency, Fractures); Future - VITAMIN D  25 Hydroxy (Vit-D Deficiency, Fractures)  3. Episode of recurrent major depressive disorder, unspecified depression episode severity (HCC) - Will recheck vitamin B 12. He was advised to continue to follow up at [facility] for his mental health care and to call the Crisis help line for worsening symptoms. - B12 and Folate Panel    Return in about 13 weeks (around 05/12/2024), or if symptoms worsen or fail to improve.    Tran Randle E Eugenie Harewood, NP

## 2024-02-12 LAB — B12 AND FOLATE PANEL
Folate: 6.6 ng/mL (ref >3.0)
Vitamin B-12: 970 pg/mL (ref 232–1245)

## 2024-02-12 LAB — VITAMIN D 25 HYDROXY (VIT D DEFICIENCY, FRACTURES): Vit D, 25-Hydroxy: 33.8 ng/mL (ref 30.0–100.0)

## 2024-02-13 ENCOUNTER — Other Ambulatory Visit: Payer: Self-pay

## 2024-02-17 ENCOUNTER — Other Ambulatory Visit: Payer: Self-pay

## 2024-02-18 ENCOUNTER — Other Ambulatory Visit: Payer: Self-pay

## 2024-02-19 ENCOUNTER — Other Ambulatory Visit: Payer: Self-pay

## 2024-02-20 ENCOUNTER — Other Ambulatory Visit: Payer: Self-pay

## 2024-02-21 ENCOUNTER — Other Ambulatory Visit: Payer: Self-pay

## 2024-02-24 ENCOUNTER — Other Ambulatory Visit: Payer: Self-pay

## 2024-02-25 ENCOUNTER — Other Ambulatory Visit: Payer: Self-pay

## 2024-02-27 ENCOUNTER — Other Ambulatory Visit: Payer: Self-pay

## 2024-03-02 ENCOUNTER — Other Ambulatory Visit: Payer: Self-pay

## 2024-03-03 ENCOUNTER — Other Ambulatory Visit: Payer: Self-pay

## 2024-03-04 ENCOUNTER — Other Ambulatory Visit: Payer: Self-pay

## 2024-03-09 ENCOUNTER — Other Ambulatory Visit: Payer: Self-pay

## 2024-03-10 ENCOUNTER — Other Ambulatory Visit: Payer: Self-pay

## 2024-03-11 ENCOUNTER — Other Ambulatory Visit: Payer: Self-pay | Admitting: Gerontology

## 2024-03-11 ENCOUNTER — Other Ambulatory Visit: Payer: Self-pay

## 2024-03-12 ENCOUNTER — Other Ambulatory Visit: Payer: Self-pay

## 2024-03-12 ENCOUNTER — Other Ambulatory Visit: Payer: Self-pay | Admitting: Gerontology

## 2024-03-12 MED ORDER — DULOXETINE HCL 60 MG PO CPEP
60.0000 mg | ORAL_CAPSULE | Freq: Every day | ORAL | 0 refills | Status: AC
  Filled 2024-03-12: qty 30, 30d supply, fill #0

## 2024-03-13 ENCOUNTER — Other Ambulatory Visit: Payer: Self-pay | Admitting: Gerontology

## 2024-03-13 ENCOUNTER — Other Ambulatory Visit: Payer: Self-pay

## 2024-03-17 ENCOUNTER — Other Ambulatory Visit: Payer: Self-pay

## 2024-03-17 ENCOUNTER — Other Ambulatory Visit: Payer: Self-pay | Admitting: Gerontology

## 2024-03-18 ENCOUNTER — Other Ambulatory Visit: Payer: Self-pay

## 2024-03-18 ENCOUNTER — Other Ambulatory Visit (HOSPITAL_BASED_OUTPATIENT_CLINIC_OR_DEPARTMENT_OTHER): Payer: Self-pay

## 2024-03-19 ENCOUNTER — Other Ambulatory Visit: Payer: Self-pay

## 2024-03-20 ENCOUNTER — Other Ambulatory Visit: Payer: Self-pay

## 2024-03-21 ENCOUNTER — Other Ambulatory Visit: Payer: Self-pay

## 2024-03-22 ENCOUNTER — Other Ambulatory Visit: Payer: Self-pay

## 2024-03-23 ENCOUNTER — Other Ambulatory Visit: Payer: Self-pay

## 2024-03-24 ENCOUNTER — Other Ambulatory Visit: Payer: Self-pay

## 2024-03-25 ENCOUNTER — Other Ambulatory Visit: Payer: Self-pay

## 2024-03-26 ENCOUNTER — Other Ambulatory Visit: Payer: Self-pay

## 2024-03-27 ENCOUNTER — Other Ambulatory Visit: Payer: Self-pay

## 2024-03-28 ENCOUNTER — Other Ambulatory Visit: Payer: Self-pay

## 2024-03-30 ENCOUNTER — Other Ambulatory Visit: Payer: Self-pay

## 2024-03-30 MED ORDER — LAMOTRIGINE 100 MG PO TABS
100.0000 mg | ORAL_TABLET | Freq: Every day | ORAL | 0 refills | Status: DC
  Filled 2024-03-30: qty 30, 30d supply, fill #0

## 2024-03-30 MED ORDER — VRAYLAR 1.5 MG PO CAPS: 1.5000 mg | ORAL_CAPSULE | Freq: Every day | ORAL | 0 refills | Status: AC

## 2024-03-31 ENCOUNTER — Other Ambulatory Visit: Payer: Self-pay

## 2024-04-01 ENCOUNTER — Other Ambulatory Visit: Payer: Self-pay

## 2024-04-02 ENCOUNTER — Other Ambulatory Visit: Payer: Self-pay

## 2024-04-02 MED ORDER — DULOXETINE HCL 30 MG PO CPEP
30.0000 mg | ORAL_CAPSULE | Freq: Every morning | ORAL | 1 refills | Status: DC
  Filled 2024-04-02: qty 30, 30d supply, fill #0
  Filled 2024-04-29: qty 30, 30d supply, fill #1

## 2024-04-02 MED ORDER — AMANTADINE HCL 100 MG PO TABS
ORAL_TABLET | ORAL | 1 refills | Status: AC
  Filled 2024-04-02: qty 30, 32d supply, fill #0
  Filled 2024-05-26: qty 30, 28d supply, fill #1

## 2024-04-08 ENCOUNTER — Other Ambulatory Visit: Payer: Self-pay

## 2024-04-21 ENCOUNTER — Other Ambulatory Visit: Payer: Self-pay

## 2024-04-22 ENCOUNTER — Other Ambulatory Visit: Payer: Self-pay

## 2024-04-23 ENCOUNTER — Other Ambulatory Visit: Payer: Self-pay

## 2024-04-24 ENCOUNTER — Other Ambulatory Visit: Payer: Self-pay

## 2024-04-27 ENCOUNTER — Other Ambulatory Visit: Payer: Self-pay

## 2024-04-28 ENCOUNTER — Other Ambulatory Visit: Payer: Self-pay

## 2024-04-29 ENCOUNTER — Other Ambulatory Visit: Payer: Self-pay

## 2024-04-30 ENCOUNTER — Other Ambulatory Visit: Payer: Self-pay

## 2024-04-30 MED ORDER — VRAYLAR 1.5 MG PO CAPS
1.5000 mg | ORAL_CAPSULE | Freq: Every day | ORAL | 0 refills | Status: AC
Start: 2024-04-30 — End: ?
  Filled 2024-04-30: qty 30, 30d supply, fill #0

## 2024-04-30 MED ORDER — DULOXETINE HCL 60 MG PO CPEP
60.0000 mg | ORAL_CAPSULE | Freq: Every day | ORAL | 0 refills | Status: DC
  Filled 2024-04-30: qty 30, 30d supply, fill #0

## 2024-04-30 MED ORDER — LAMOTRIGINE 100 MG PO TABS
100.0000 mg | ORAL_TABLET | Freq: Every day | ORAL | 0 refills | Status: DC
  Filled 2024-04-30: qty 30, 30d supply, fill #0

## 2024-05-04 ENCOUNTER — Other Ambulatory Visit: Payer: Self-pay

## 2024-05-04 MED ORDER — LAMOTRIGINE 100 MG PO TABS
100.0000 mg | ORAL_TABLET | Freq: Every day | ORAL | 0 refills | Status: AC
  Filled 2024-05-26: qty 90, 90d supply, fill #0
  Filled 2024-06-15: qty 30, 30d supply, fill #0
  Filled 2024-07-17: qty 30, 30d supply, fill #1

## 2024-05-04 MED ORDER — DULOXETINE HCL 30 MG PO CPEP
30.0000 mg | ORAL_CAPSULE | Freq: Every morning | ORAL | 0 refills | Status: AC
  Filled 2024-06-15: qty 30, 30d supply, fill #0
  Filled 2024-07-17: qty 30, 30d supply, fill #1

## 2024-05-04 MED ORDER — VRAYLAR 1.5 MG PO CAPS
1.5000 mg | ORAL_CAPSULE | Freq: Every day | ORAL | 0 refills | Status: AC
  Filled 2024-05-04 – 2024-05-26 (×2): qty 90, 90d supply, fill #0

## 2024-05-04 MED ORDER — DULOXETINE HCL 60 MG PO CPEP
60.0000 mg | ORAL_CAPSULE | Freq: Every day | ORAL | 0 refills | Status: AC
  Filled 2024-06-15: qty 30, 30d supply, fill #0
  Filled 2024-07-17: qty 30, 30d supply, fill #1

## 2024-05-12 ENCOUNTER — Ambulatory Visit: Payer: Self-pay | Admitting: Gerontology

## 2024-05-18 ENCOUNTER — Other Ambulatory Visit: Payer: Self-pay

## 2024-05-19 ENCOUNTER — Other Ambulatory Visit: Payer: Self-pay

## 2024-05-25 ENCOUNTER — Other Ambulatory Visit: Payer: Self-pay

## 2024-05-26 ENCOUNTER — Other Ambulatory Visit: Payer: Self-pay

## 2024-05-27 ENCOUNTER — Other Ambulatory Visit: Payer: Self-pay

## 2024-05-28 ENCOUNTER — Other Ambulatory Visit: Payer: Self-pay

## 2024-06-02 ENCOUNTER — Other Ambulatory Visit: Payer: Self-pay

## 2024-06-02 MED ORDER — LAMOTRIGINE 100 MG PO TBDP
100.0000 mg | ORAL_TABLET | Freq: Every day | ORAL | 0 refills | Status: AC
  Filled 2024-06-02: qty 90, 90d supply, fill #0

## 2024-06-15 ENCOUNTER — Other Ambulatory Visit (HOSPITAL_COMMUNITY): Payer: Self-pay

## 2024-06-15 ENCOUNTER — Other Ambulatory Visit: Payer: Self-pay

## 2024-06-17 ENCOUNTER — Other Ambulatory Visit: Payer: Self-pay

## 2024-07-17 ENCOUNTER — Other Ambulatory Visit: Payer: Self-pay

## 2024-07-22 ENCOUNTER — Other Ambulatory Visit: Payer: Self-pay

## 2024-07-28 ENCOUNTER — Other Ambulatory Visit: Payer: Self-pay

## 2024-08-12 ENCOUNTER — Other Ambulatory Visit: Payer: Self-pay

## 2024-08-12 MED ORDER — DULOXETINE HCL 30 MG PO CPEP
30.0000 mg | ORAL_CAPSULE | Freq: Every morning | ORAL | 0 refills | Status: AC
  Filled 2024-08-12: qty 90, 90d supply, fill #0

## 2024-08-12 MED ORDER — DULOXETINE HCL 60 MG PO CPEP
60.0000 mg | ORAL_CAPSULE | Freq: Every day | ORAL | 0 refills | Status: AC
  Filled 2024-08-12: qty 90, 90d supply, fill #0

## 2024-08-12 MED ORDER — VRAYLAR 1.5 MG PO CAPS
1.5000 mg | ORAL_CAPSULE | Freq: Every day | ORAL | 0 refills | Status: AC
  Filled 2024-08-12: qty 90, 90d supply, fill #0

## 2024-08-12 MED ORDER — LAMOTRIGINE 100 MG PO TBDP
100.0000 mg | ORAL_TABLET | Freq: Every day | ORAL | 0 refills | Status: AC
  Filled 2024-08-12: qty 90, 90d supply, fill #0

## 2024-08-13 ENCOUNTER — Other Ambulatory Visit: Payer: Self-pay

## 2024-08-19 ENCOUNTER — Other Ambulatory Visit: Payer: Self-pay

## 2024-08-20 ENCOUNTER — Other Ambulatory Visit: Payer: Self-pay

## 2024-08-20 MED ORDER — CARIPRAZINE HCL 1.5 MG PO CAPS: 1.5000 mg | ORAL_CAPSULE | Freq: Every day | ORAL | 11 refills | Status: AC
